# Patient Record
Sex: Male | Born: 1950 | Race: Black or African American | Hispanic: No | Marital: Married | Smoking: Former smoker
Health system: Southern US, Community
[De-identification: ages and names within clinical notes are randomized; demographics above are authoritative.]

## PROBLEM LIST (undated history)

## (undated) DIAGNOSIS — E663 Overweight: Secondary | ICD-10-CM

## (undated) DIAGNOSIS — N400 Enlarged prostate without lower urinary tract symptoms: Secondary | ICD-10-CM

## (undated) DIAGNOSIS — K5730 Diverticulosis of large intestine without perforation or abscess without bleeding: Secondary | ICD-10-CM

## (undated) DIAGNOSIS — Z860100 Personal history of colon polyps, unspecified: Secondary | ICD-10-CM

## (undated) DIAGNOSIS — E7800 Pure hypercholesterolemia, unspecified: Secondary | ICD-10-CM

## (undated) DIAGNOSIS — G4733 Obstructive sleep apnea (adult) (pediatric): Secondary | ICD-10-CM

## (undated) DIAGNOSIS — G4730 Sleep apnea, unspecified: Secondary | ICD-10-CM

## (undated) DIAGNOSIS — E119 Type 2 diabetes mellitus without complications: Secondary | ICD-10-CM

## (undated) DIAGNOSIS — M1990 Unspecified osteoarthritis, unspecified site: Secondary | ICD-10-CM

## (undated) DIAGNOSIS — I10 Essential (primary) hypertension: Secondary | ICD-10-CM

## (undated) DIAGNOSIS — R945 Abnormal results of liver function studies: Secondary | ICD-10-CM

## (undated) DIAGNOSIS — R7309 Other abnormal glucose: Secondary | ICD-10-CM

## (undated) DIAGNOSIS — R7989 Other specified abnormal findings of blood chemistry: Secondary | ICD-10-CM

## (undated) HISTORY — DX: Personal history of colon polyps, unspecified: Z86.0100

## (undated) HISTORY — DX: Benign prostatic hyperplasia without lower urinary tract symptoms: N40.0

## (undated) HISTORY — DX: Abnormal results of liver function studies: R94.5

## (undated) HISTORY — DX: Essential (primary) hypertension: I10

## (undated) HISTORY — PX: VASECTOMY: SHX75

## (undated) HISTORY — DX: Unspecified osteoarthritis, unspecified site: M19.90

## (undated) HISTORY — DX: Other specified abnormal findings of blood chemistry: R79.89

## (undated) HISTORY — DX: Pure hypercholesterolemia, unspecified: E78.00

## (undated) HISTORY — DX: Other abnormal glucose: R73.09

## (undated) HISTORY — DX: Sleep apnea, unspecified: G47.30

## (undated) HISTORY — DX: Type 2 diabetes mellitus without complications: E11.9

## (undated) HISTORY — DX: Overweight: E66.3

## (undated) HISTORY — DX: Obstructive sleep apnea (adult) (pediatric): G47.33

## (undated) HISTORY — DX: Diverticulosis of large intestine without perforation or abscess without bleeding: K57.30

## (undated) HISTORY — DX: Personal history of colonic polyps: Z86.010

---

## 1999-08-01 ENCOUNTER — Ambulatory Visit (HOSPITAL_COMMUNITY): Admission: RE | Admit: 1999-08-01 | Discharge: 1999-08-01 | Payer: Self-pay | Admitting: Orthopedic Surgery

## 1999-08-01 ENCOUNTER — Encounter: Payer: Self-pay | Admitting: Orthopedic Surgery

## 2001-11-28 ENCOUNTER — Emergency Department (HOSPITAL_COMMUNITY): Admission: EM | Admit: 2001-11-28 | Discharge: 2001-11-28 | Payer: Self-pay | Admitting: *Deleted

## 2004-10-17 ENCOUNTER — Ambulatory Visit: Payer: Self-pay | Admitting: Pulmonary Disease

## 2005-03-12 ENCOUNTER — Ambulatory Visit: Payer: Self-pay | Admitting: Pulmonary Disease

## 2005-03-18 ENCOUNTER — Ambulatory Visit: Payer: Self-pay | Admitting: Pulmonary Disease

## 2005-09-02 ENCOUNTER — Ambulatory Visit: Payer: Self-pay | Admitting: Pulmonary Disease

## 2005-11-28 ENCOUNTER — Ambulatory Visit: Payer: Self-pay | Admitting: Pulmonary Disease

## 2005-12-06 ENCOUNTER — Ambulatory Visit: Payer: Self-pay | Admitting: Pulmonary Disease

## 2005-12-19 ENCOUNTER — Ambulatory Visit: Payer: Self-pay | Admitting: Gastroenterology

## 2006-02-04 ENCOUNTER — Ambulatory Visit: Payer: Self-pay | Admitting: Gastroenterology

## 2006-02-11 ENCOUNTER — Encounter (INDEPENDENT_AMBULATORY_CARE_PROVIDER_SITE_OTHER): Payer: Self-pay | Admitting: *Deleted

## 2006-02-11 ENCOUNTER — Ambulatory Visit: Payer: Self-pay | Admitting: Gastroenterology

## 2006-03-07 ENCOUNTER — Ambulatory Visit: Payer: Self-pay | Admitting: Pulmonary Disease

## 2006-03-20 ENCOUNTER — Ambulatory Visit: Payer: Self-pay | Admitting: Cardiology

## 2006-06-24 ENCOUNTER — Ambulatory Visit: Payer: Self-pay | Admitting: Cardiology

## 2006-06-26 ENCOUNTER — Ambulatory Visit: Payer: Self-pay | Admitting: Internal Medicine

## 2006-12-18 ENCOUNTER — Ambulatory Visit: Payer: Self-pay | Admitting: Pulmonary Disease

## 2006-12-18 LAB — CONVERTED CEMR LAB
ALT: 70 units/L — ABNORMAL HIGH (ref 0–40)
AST: 38 units/L — ABNORMAL HIGH (ref 0–37)
Albumin: 4.1 g/dL (ref 3.5–5.2)
Alkaline Phosphatase: 67 units/L (ref 39–117)
BUN: 18 mg/dL (ref 6–23)
Basophils Absolute: 0 10*3/uL (ref 0.0–0.1)
Basophils Relative: 0.7 % (ref 0.0–1.0)
Bilirubin Urine: NEGATIVE
Bilirubin, Direct: 0.2 mg/dL (ref 0.0–0.3)
CO2: 31 meq/L (ref 19–32)
Calcium: 9.5 mg/dL (ref 8.4–10.5)
Chloride: 104 meq/L (ref 96–112)
Cholesterol: 158 mg/dL (ref 0–200)
Creatinine, Ser: 1 mg/dL (ref 0.4–1.5)
Eosinophils Absolute: 0 10*3/uL (ref 0.0–0.6)
Eosinophils Relative: 0.9 % (ref 0.0–5.0)
GFR calc Af Amer: 99 mL/min
GFR calc non Af Amer: 82 mL/min
Glucose, Bld: 117 mg/dL — ABNORMAL HIGH (ref 70–99)
HCT: 43.9 % (ref 39.0–52.0)
HDL: 34.2 mg/dL — ABNORMAL LOW (ref >39.0)
Hemoglobin, Urine: NEGATIVE
Hemoglobin: 15.6 g/dL (ref 13.0–17.0)
Ketones, ur: NEGATIVE mg/dL
LDL Cholesterol: 99 mg/dL (ref 0–99)
Leukocytes, UA: NEGATIVE
Lymphocytes Relative: 44 % (ref 12.0–46.0)
MCHC: 35.6 g/dL (ref 30.0–36.0)
MCV: 93.9 fL (ref 78.0–100.0)
Monocytes Absolute: 0.4 10*3/uL (ref 0.2–0.7)
Monocytes Relative: 9.3 % (ref 3.0–11.0)
Neutro Abs: 1.8 10*3/uL (ref 1.4–7.7)
Neutrophils Relative %: 45.1 % (ref 43.0–77.0)
Nitrite: NEGATIVE
PSA: 0.66 ng/mL (ref 0.10–4.00)
Platelets: 169 10*3/uL (ref 150–400)
Potassium: 3.8 meq/L (ref 3.5–5.1)
RBC: 4.67 M/uL (ref 4.22–5.81)
RDW: 12.4 % (ref 11.5–14.6)
Sodium: 139 meq/L (ref 135–145)
Specific Gravity, Urine: 1.025 (ref 1.000–1.03)
TSH: 1.44 microintl units/mL (ref 0.35–5.50)
Total Bilirubin: 1.4 mg/dL — ABNORMAL HIGH (ref 0.3–1.2)
Total CHOL/HDL Ratio: 4.6
Total Protein, Urine: NEGATIVE mg/dL
Total Protein: 7.3 g/dL (ref 6.0–8.3)
Triglycerides: 123 mg/dL (ref 0–149)
Urine Glucose: 250 mg/dL — AB
Urobilinogen, UA: 0.2 (ref 0.0–1.0)
VLDL: 25 mg/dL (ref 0–40)
WBC: 4.1 10*3/uL — ABNORMAL LOW (ref 4.5–10.5)
pH: 6 (ref 5.0–8.0)

## 2007-01-08 ENCOUNTER — Ambulatory Visit: Payer: Self-pay | Admitting: Pulmonary Disease

## 2008-01-12 DIAGNOSIS — Z87898 Personal history of other specified conditions: Secondary | ICD-10-CM

## 2008-01-12 DIAGNOSIS — E7800 Pure hypercholesterolemia, unspecified: Secondary | ICD-10-CM

## 2008-01-12 DIAGNOSIS — I10 Essential (primary) hypertension: Secondary | ICD-10-CM | POA: Insufficient documentation

## 2008-01-20 ENCOUNTER — Telehealth (INDEPENDENT_AMBULATORY_CARE_PROVIDER_SITE_OTHER): Payer: Self-pay | Admitting: *Deleted

## 2008-02-01 ENCOUNTER — Telehealth: Payer: Self-pay | Admitting: Pulmonary Disease

## 2008-02-02 ENCOUNTER — Ambulatory Visit: Payer: Self-pay | Admitting: Pulmonary Disease

## 2008-02-02 DIAGNOSIS — E663 Overweight: Secondary | ICD-10-CM | POA: Insufficient documentation

## 2008-02-02 DIAGNOSIS — K5730 Diverticulosis of large intestine without perforation or abscess without bleeding: Secondary | ICD-10-CM | POA: Insufficient documentation

## 2008-02-02 DIAGNOSIS — G4733 Obstructive sleep apnea (adult) (pediatric): Secondary | ICD-10-CM

## 2008-02-02 DIAGNOSIS — D126 Benign neoplasm of colon, unspecified: Secondary | ICD-10-CM

## 2008-02-02 DIAGNOSIS — M1990 Unspecified osteoarthritis, unspecified site: Secondary | ICD-10-CM | POA: Insufficient documentation

## 2008-02-02 LAB — CONVERTED CEMR LAB
ALT: 59 units/L — ABNORMAL HIGH (ref 0–53)
AST: 35 units/L (ref 0–37)
Albumin: 4.1 g/dL (ref 3.5–5.2)
Alkaline Phosphatase: 54 units/L (ref 39–117)
BUN: 18 mg/dL (ref 6–23)
Basophils Absolute: 0 10*3/uL (ref 0.0–0.1)
Basophils Relative: 0.9 % (ref 0.0–1.0)
Bilirubin Urine: NEGATIVE
Bilirubin, Direct: 0.2 mg/dL (ref 0.0–0.3)
CO2: 29 meq/L (ref 19–32)
Calcium: 9.4 mg/dL (ref 8.4–10.5)
Chloride: 100 meq/L (ref 96–112)
Cholesterol: 165 mg/dL (ref 0–200)
Creatinine, Ser: 1.1 mg/dL (ref 0.4–1.5)
Eosinophils Absolute: 0 10*3/uL (ref 0.0–0.7)
Eosinophils Relative: 1.2 % (ref 0.0–5.0)
GFR calc Af Amer: 88 mL/min
GFR calc non Af Amer: 73 mL/min
Glucose, Bld: 158 mg/dL — ABNORMAL HIGH (ref 70–99)
HCT: 44 % (ref 39.0–52.0)
HDL: 36.8 mg/dL — ABNORMAL LOW (ref >39.0)
Hemoglobin, Urine: NEGATIVE
Hemoglobin: 15.7 g/dL (ref 13.0–17.0)
Ketones, ur: NEGATIVE mg/dL
LDL Cholesterol: 95 mg/dL (ref 0–99)
Leukocytes, UA: NEGATIVE
Lymphocytes Relative: 45.7 % (ref 12.0–46.0)
MCHC: 35.6 g/dL (ref 30.0–36.0)
MCV: 96.4 fL (ref 78.0–100.0)
Monocytes Absolute: 0.5 10*3/uL (ref 0.1–1.0)
Monocytes Relative: 11.4 % (ref 3.0–12.0)
Neutro Abs: 1.7 10*3/uL (ref 1.4–7.7)
Neutrophils Relative %: 40.8 % — ABNORMAL LOW (ref 43.0–77.0)
Nitrite: NEGATIVE
PSA: 0.52 ng/mL (ref 0.10–4.00)
Platelets: 161 10*3/uL (ref 150–400)
Potassium: 4.2 meq/L (ref 3.5–5.1)
RBC: 4.56 M/uL (ref 4.22–5.81)
RDW: 12.6 % (ref 11.5–14.6)
Sodium: 136 meq/L (ref 135–145)
Specific Gravity, Urine: >=1.03 (ref 1.000–1.03)
TSH: 1.64 microintl units/mL (ref 0.35–5.50)
Total Bilirubin: 1.4 mg/dL — ABNORMAL HIGH (ref 0.3–1.2)
Total CHOL/HDL Ratio: 4.5
Total Protein, Urine: NEGATIVE mg/dL
Total Protein: 7.1 g/dL (ref 6.0–8.3)
Triglycerides: 165 mg/dL — ABNORMAL HIGH (ref 0–149)
Urine Glucose: NEGATIVE mg/dL
Urobilinogen, UA: 0.2 (ref 0.0–1.0)
VLDL: 33 mg/dL (ref 0–40)
WBC: 4.1 10*3/uL — ABNORMAL LOW (ref 4.5–10.5)
pH: 5.5 (ref 5.0–8.0)

## 2008-02-09 ENCOUNTER — Telehealth: Payer: Self-pay | Admitting: Pulmonary Disease

## 2008-06-20 ENCOUNTER — Telehealth (INDEPENDENT_AMBULATORY_CARE_PROVIDER_SITE_OTHER): Payer: Self-pay | Admitting: *Deleted

## 2008-10-14 ENCOUNTER — Telehealth (INDEPENDENT_AMBULATORY_CARE_PROVIDER_SITE_OTHER): Payer: Self-pay | Admitting: *Deleted

## 2009-01-06 ENCOUNTER — Ambulatory Visit: Payer: Self-pay | Admitting: Pulmonary Disease

## 2009-02-17 ENCOUNTER — Telehealth (INDEPENDENT_AMBULATORY_CARE_PROVIDER_SITE_OTHER): Payer: Self-pay | Admitting: *Deleted

## 2009-05-04 ENCOUNTER — Encounter (INDEPENDENT_AMBULATORY_CARE_PROVIDER_SITE_OTHER): Payer: Self-pay | Admitting: *Deleted

## 2009-06-09 ENCOUNTER — Encounter (INDEPENDENT_AMBULATORY_CARE_PROVIDER_SITE_OTHER): Payer: Self-pay | Admitting: *Deleted

## 2009-06-12 ENCOUNTER — Ambulatory Visit: Payer: Self-pay | Admitting: Gastroenterology

## 2009-06-27 ENCOUNTER — Ambulatory Visit: Payer: Self-pay | Admitting: Gastroenterology

## 2009-06-29 ENCOUNTER — Encounter: Payer: Self-pay | Admitting: Gastroenterology

## 2009-07-04 ENCOUNTER — Telehealth: Payer: Self-pay | Admitting: Pulmonary Disease

## 2009-07-06 ENCOUNTER — Emergency Department (HOSPITAL_COMMUNITY): Admission: EM | Admit: 2009-07-06 | Discharge: 2009-07-06 | Payer: Self-pay | Admitting: Emergency Medicine

## 2009-08-10 ENCOUNTER — Telehealth (INDEPENDENT_AMBULATORY_CARE_PROVIDER_SITE_OTHER): Payer: Self-pay | Admitting: *Deleted

## 2009-09-22 ENCOUNTER — Ambulatory Visit: Payer: Self-pay | Admitting: Pulmonary Disease

## 2009-10-30 ENCOUNTER — Telehealth (INDEPENDENT_AMBULATORY_CARE_PROVIDER_SITE_OTHER): Payer: Self-pay | Admitting: *Deleted

## 2010-01-10 ENCOUNTER — Ambulatory Visit: Payer: Self-pay | Admitting: Pulmonary Disease

## 2010-01-13 DIAGNOSIS — Z862 Personal history of diseases of the blood and blood-forming organs and certain disorders involving the immune mechanism: Secondary | ICD-10-CM

## 2010-01-13 DIAGNOSIS — Z8639 Personal history of other endocrine, nutritional and metabolic disease: Secondary | ICD-10-CM | POA: Insufficient documentation

## 2010-01-13 LAB — CONVERTED CEMR LAB
ALT: 57 units/L — ABNORMAL HIGH (ref 0–53)
AST: 39 units/L — ABNORMAL HIGH (ref 0–37)
Albumin: 4.4 g/dL (ref 3.5–5.2)
Alkaline Phosphatase: 61 units/L (ref 39–117)
BUN: 29 mg/dL — ABNORMAL HIGH (ref 6–23)
Basophils Absolute: 0 10*3/uL (ref 0.0–0.1)
Basophils Relative: 1.1 % (ref 0.0–3.0)
Bilirubin Urine: NEGATIVE
Bilirubin, Direct: 0.2 mg/dL (ref 0.0–0.3)
CO2: 29 meq/L (ref 19–32)
Calcium: 9.9 mg/dL (ref 8.4–10.5)
Chloride: 107 meq/L (ref 96–112)
Cholesterol: 180 mg/dL (ref 0–200)
Creatinine, Ser: 1 mg/dL (ref 0.4–1.5)
Eosinophils Absolute: 0.1 10*3/uL (ref 0.0–0.7)
Eosinophils Relative: 2 % (ref 0.0–5.0)
GFR calc non Af Amer: 100.04 mL/min (ref >60)
Glucose, Bld: 148 mg/dL — ABNORMAL HIGH (ref 70–99)
HCT: 41 % (ref 39.0–52.0)
HDL: 42.5 mg/dL (ref >39.00)
Hemoglobin: 14.5 g/dL (ref 13.0–17.0)
Hgb A1c MFr Bld: 6.6 % — ABNORMAL HIGH (ref 4.6–6.5)
Ketones, ur: NEGATIVE mg/dL
LDL Cholesterol: 108 mg/dL — ABNORMAL HIGH (ref 0–99)
Leukocytes, UA: NEGATIVE
Lymphocytes Relative: 45.1 % (ref 12.0–46.0)
Lymphs Abs: 1.8 10*3/uL (ref 0.7–4.0)
MCHC: 35.4 g/dL (ref 30.0–36.0)
MCV: 96.9 fL (ref 78.0–100.0)
Monocytes Absolute: 0.5 10*3/uL (ref 0.1–1.0)
Monocytes Relative: 12.4 % — ABNORMAL HIGH (ref 3.0–12.0)
Neutro Abs: 1.6 10*3/uL (ref 1.4–7.7)
Neutrophils Relative %: 39.4 % — ABNORMAL LOW (ref 43.0–77.0)
Nitrite: NEGATIVE
PSA: 0.59 ng/mL (ref 0.10–4.00)
Platelets: 164 10*3/uL (ref 150.0–400.0)
Potassium: 5.2 meq/L — ABNORMAL HIGH (ref 3.5–5.1)
RBC: 4.23 M/uL (ref 4.22–5.81)
RDW: 12.7 % (ref 11.5–14.6)
Sodium: 142 meq/L (ref 135–145)
Specific Gravity, Urine: >=1.03 (ref 1.000–1.030)
TSH: 1.74 microintl units/mL (ref 0.35–5.50)
Total Bilirubin: 1.1 mg/dL (ref 0.3–1.2)
Total CHOL/HDL Ratio: 4
Total Protein, Urine: NEGATIVE mg/dL
Total Protein: 7.1 g/dL (ref 6.0–8.3)
Triglycerides: 148 mg/dL (ref 0.0–149.0)
Urine Glucose: NEGATIVE mg/dL
Urobilinogen, UA: 0.2 (ref 0.0–1.0)
VLDL: 29.6 mg/dL (ref 0.0–40.0)
WBC: 4.1 10*3/uL — ABNORMAL LOW (ref 4.5–10.5)
pH: 5.5 (ref 5.0–8.0)

## 2010-01-25 ENCOUNTER — Telehealth (INDEPENDENT_AMBULATORY_CARE_PROVIDER_SITE_OTHER): Payer: Self-pay | Admitting: *Deleted

## 2010-03-12 ENCOUNTER — Telehealth (INDEPENDENT_AMBULATORY_CARE_PROVIDER_SITE_OTHER): Payer: Self-pay | Admitting: *Deleted

## 2010-05-08 ENCOUNTER — Telehealth: Payer: Self-pay | Admitting: Pulmonary Disease

## 2010-05-22 ENCOUNTER — Telehealth (INDEPENDENT_AMBULATORY_CARE_PROVIDER_SITE_OTHER): Payer: Self-pay | Admitting: *Deleted

## 2010-05-22 ENCOUNTER — Encounter: Payer: Self-pay | Admitting: Pulmonary Disease

## 2010-07-31 ENCOUNTER — Encounter
Admission: RE | Admit: 2010-07-31 | Discharge: 2010-07-31 | Payer: Self-pay | Source: Home / Self Care | Attending: Orthopedic Surgery | Admitting: Orthopedic Surgery

## 2010-08-26 LAB — CONVERTED CEMR LAB
ALT: 65 units/L — ABNORMAL HIGH (ref 0–53)
AST: 47 units/L — ABNORMAL HIGH (ref 0–37)
Albumin: 4.3 g/dL (ref 3.5–5.2)
Alkaline Phosphatase: 71 units/L (ref 39–117)
BUN: 24 mg/dL — ABNORMAL HIGH (ref 6–23)
Basophils Absolute: 0 10*3/uL (ref 0.0–0.1)
Basophils Relative: 0.4 % (ref 0.0–3.0)
Bilirubin Urine: NEGATIVE
Bilirubin, Direct: 0.2 mg/dL (ref 0.0–0.3)
CO2: 33 meq/L — ABNORMAL HIGH (ref 19–32)
Calcium: 9.8 mg/dL (ref 8.4–10.5)
Chloride: 104 meq/L (ref 96–112)
Cholesterol: 148 mg/dL (ref 0–200)
Creatinine, Ser: 1 mg/dL (ref 0.4–1.5)
Eosinophils Absolute: 0 10*3/uL (ref 0.0–0.7)
Eosinophils Relative: 0.6 % (ref 0.0–5.0)
GFR calc non Af Amer: 98.07 mL/min (ref >60)
Glucose, Bld: 111 mg/dL — ABNORMAL HIGH (ref 70–99)
HCT: 43.2 % (ref 39.0–52.0)
HDL: 43.6 mg/dL (ref >39.00)
Hemoglobin, Urine: NEGATIVE
Hemoglobin: 15.2 g/dL (ref 13.0–17.0)
Hgb A1c MFr Bld: 6.4 % (ref 4.6–6.5)
Ketones, ur: NEGATIVE mg/dL
LDL Cholesterol: 90 mg/dL (ref 0–99)
Leukocytes, UA: NEGATIVE
Lymphocytes Relative: 46.1 % — ABNORMAL HIGH (ref 12.0–46.0)
Lymphs Abs: 1.4 10*3/uL (ref 0.7–4.0)
MCHC: 35.3 g/dL (ref 30.0–36.0)
MCV: 96.8 fL (ref 78.0–100.0)
Monocytes Absolute: 0.5 10*3/uL (ref 0.1–1.0)
Monocytes Relative: 15.1 % — ABNORMAL HIGH (ref 3.0–12.0)
Neutro Abs: 1.2 10*3/uL — ABNORMAL LOW (ref 1.4–7.7)
Neutrophils Relative %: 37.8 % — ABNORMAL LOW (ref 43.0–77.0)
Nitrite: NEGATIVE
Platelets: 153 10*3/uL (ref 150.0–400.0)
Potassium: 4.2 meq/L (ref 3.5–5.1)
RBC: 4.46 M/uL (ref 4.22–5.81)
RDW: 12.3 % (ref 11.5–14.6)
Sodium: 140 meq/L (ref 135–145)
Specific Gravity, Urine: 1.02 (ref 1.000–1.030)
TSH: 1.08 microintl units/mL (ref 0.35–5.50)
Total Bilirubin: 1.6 mg/dL — ABNORMAL HIGH (ref 0.3–1.2)
Total CHOL/HDL Ratio: 3
Total Protein, Urine: NEGATIVE mg/dL
Total Protein: 7.5 g/dL (ref 6.0–8.3)
Triglycerides: 74 mg/dL (ref 0.0–149.0)
Urine Glucose: NEGATIVE mg/dL
Urobilinogen, UA: 0.2 (ref 0.0–1.0)
VLDL: 14.8 mg/dL (ref 0.0–40.0)
WBC: 3.1 10*3/uL — ABNORMAL LOW (ref 4.5–10.5)
pH: 5.5 (ref 5.0–8.0)

## 2010-08-30 NOTE — Assessment & Plan Note (Signed)
Summary: physical 1 yr ///kp   CC:  Yearly ROV & CPX....  History of Present Illness: 60 y/o BM here for a folllow up visit and yearly CPX...    ~  Jun10:  he reports having a good year, no new complaints or concerns... he is still too heavy but by his own admission he really hasn't been dieting, exercising, etc...   ~  January 10, 2010:  Yearly ROV doing satis & his CC is paresthesias in toes> could be early neuropathy w/ his mild gluc intol & offered Lyrica trial but it's not that bad & he wants to watch it... he notes rare episodes of self-limited palpit> rapid regular & review of hx shows episode of PAT yrs ago Rx in ER w/ "shot" converted to NSR... we discussed no caffeine etc & he will let us know if symptoms worsen...    Current Problem List:  PHYSICAL EXAMINATION (ICD-V70.0) - he is 60 y/o... ex-smoker, quit  ~70yrs... he sees DrNesi for Urology yearly... he had Pneumovax in 62 (age 63)...  Hx of OBSTRUCTIVE SLEEP APNEA (ICD-327.23) - eval in 1996 w/ sleep study showing RDI= 19, mod snoring, O2 sat to 90%... saw DrRedman for ENT due to snoring & tonsillar hypertrophy- offered UPPP surg but pt declined... since then he's gained weight, continued snoring w/o daytime hypersomn etc... not interested in further evaluation...   HYPERTENSION (ICD-401.9) - on DIOVAN/Hct 160-25 daily... BP= 142/80 today & even better at home ave= 120's/ 80's, feeling well, tolerating med... denies HA, fatigue, visual changes, CP, palipit, dizziness, syncope, dyspnea, edema, etc...  ~  baseline EKG showed NSR, WNL.[name].  ~  baseline CXR showed clear, NAD... DJD spine.  HYPERCHOLESTEROLEMIA (ICD-272.0) - on LIPITOR 80mg /d...  ~  FLP 5/08 (wt= 234#) showed TChol 158, TG 123, HDL 34, LDL 99  ~  FLP 7/09 (wt= 247#) showed TChol 165, TG 165, HDL 37, LDL 95... same med, better diet, lose wt.  ~  FLP 6/10 (wt= 241#) showed TChol 148, TG 74, HDL 44, LDL 90  ~  FLP 6/11 (wt=240#) showed TChol 180, TG 148, HDL 43, LDL  108... needs to get wt down!  OTHER ABNORMAL GLUCOSE (ICD-790.29) - hx borderline BS in past> diet Rx...  ~  labs 6/10 showed BS= 111, A1c= 6.4  ~  labs 6/11 showed BS= 148, A1c= 6.6.Marland KitchenMarland Kitchen may need meds, get weight down.  OVERWEIGHT (ICD-278.02) - as above: we discussed diet, exercise, get weight down...  ~  weight 6/10 = 241#  ~  weight 6/11 = 240#  DIVERTICULOSIS OF COLON (ICD-562.10) & COLONIC POLYPS (ICD-211.3)  ~  colonoscopy 7/07 by DrStark showed 7mm polyp= tubular adenoma...  ~  colonoscopy 11/10 showed divertics, polyp= tub adenoma, +hems... f/u planned 54yrs.  LIVER FUNCTION TESTS, ABNORMAL, HX OF (ICD-V12.2) - could be Statin related vs Steatosis...  ~  labs 6/10 showed AlkPhos=71, SGOT=47, SGPT=65  ~  labs 6/11 showed AlkPhos=61, SGOT=39, SGPT=57  BENIGN PROSTATIC HYPERTROPHY, HX OF (ICD-V13.8) - hx of elevated PSA w/ neg prostate bx's from [facility] in 2003 (PSA was 17)...  ~  PSA 5/08 was 0.66...  ~  PSA 7/09 is 0.52...  ~  Eval 6/10 DrNesi: doing well, PSA reported 0.90  ~  labs 6/11 showed PSA= 0.59  DEGENERATIVE JOINT DISEASE (ICD-715.90) - he uses ETODOLAC as needed.   Preventive Screening-Counseling & Management  Alcohol-Tobacco     Smoking Status: quit     Year Quit: 1980  Allergies (verified): No Known  Drug Allergies  Comments:  Nurse/Medical Assistant: The patient's medications and allergies were reviewed with the patient and were updated in the Medication and Allergy Lists.  Past History:  Past Medical History: Hx of OBSTRUCTIVE SLEEP APNEA (ICD-327.23) HYPERTENSION (ICD-401.9) HYPERCHOLESTEROLEMIA (ICD-272.0) OTHER ABNORMAL GLUCOSE (ICD-790.29) OVERWEIGHT (ICD-278.02) DIVERTICULOSIS OF COLON (ICD-562.10) COLONIC POLYPS (ICD-211.3) LIVER FUNCTION TESTS, ABNORMAL, HX OF (ICD-V12.2) BENIGN PROSTATIC HYPERTROPHY, HX OF (ICD-V13.8) DEGENERATIVE JOINT DISEASE (ICD-715.90)  Past Surgical History: S/P vasectomy by Thermon Leyland  Family History: Reviewed  history from 02/02/2008 and no changes required. Father is alive, age 74, w/ DM and renal disease Mother died, age 17, w/ alzheimer's disease (bed ridden for 6 yrs) 7 sibs: no hx of heart disease or cancer 5 Bro- one w/ DM 2 Sis- a/w  Social History: Reviewed history from 09/22/2009 and no changes required. quit smoking in 1980--smoked for 10 years---1 pack x 3 days occ alcohol married 2 children owns [employer]  Review of Systems       The patient complains of palpitations and arthritis.  The patient denies fever, chills, sweats, anorexia, fatigue, weakness, malaise, weight loss, sleep disorder, blurring, diplopia, eye irritation, eye discharge, vision loss, eye pain, photophobia, earache, ear discharge, tinnitus, decreased hearing, nasal congestion, nosebleeds, sore throat, hoarseness, chest pain, syncope, dyspnea on exertion, orthopnea, PND, peripheral edema, cough, dyspnea at rest, excessive sputum, hemoptysis, wheezing, pleurisy, nausea, vomiting, diarrhea, constipation, change in bowel habits, abdominal pain, melena, hematochezia, jaundice, gas/bloating, indigestion/heartburn, dysphagia, odynophagia, dysuria, hematuria, urinary frequency, urinary hesitancy, nocturia, incontinence, back pain, joint pain, joint swelling, muscle cramps, muscle weakness, stiffness, sciatica, restless legs, leg pain at night, leg pain with exertion, rash, itching, dryness, suspicious lesions, paralysis, paresthesias, seizures, tremors, vertigo, transient blindness, frequent falls, frequent headaches, difficulty walking, depression, anxiety, memory loss, confusion, cold intolerance, heat intolerance, polydipsia, polyphagia, polyuria, unusual weight change, abnormal bruising, bleeding, enlarged lymph nodes, urticaria, allergic rash, hay fever, and recurrent infections.    Vital Signs:  Patient profile:   60 year old male Height:      71 inches Weight:      239.31 pounds BMI:     33.50 O2 Sat:       97 % on Room air Temp:     97.2 degrees F oral Pulse rate:   70 / minute BP sitting:   142 / 80  (right arm) Cuff size:   regular  Vitals Entered By: Randell Loop CMA (January 10, 2010 9:01 AM)  O2 Sat at Rest %:  97 O2 Flow:  Room air CC: Yearly ROV & CPX... Is Patient Diabetic? No Pain Assessment Patient in pain? no      Comments meds updated today with pt   Physical Exam  Additional Exam:  WD, Overweight, 60  y/o BM in NAD... GENERAL:  Alert & oriented; pleasant & cooperative... HEENT:  [place]/AT, EOM-wnl, PERRLA, EACs-clear, TMs-wnl, NOSE-clear, THROAT-clear & wnl. NECK:  Supple w/ full ROM; no JVD; normal carotid impulses w/o bruits; no thyromegaly or nodules palpated; no lymphadenopathy. CHEST:  Clear to P & A; without wheezes/ rales/ or rhonchi heard... HEART:  Regular Rhythm; without murmurs/ rubs/ or gallops detected... ABDOMEN:  Soft & nontender; normal bowel sounds; no organomegaly or masses palpated... EXT: without deformities or arthritic changes; no varicose veins/ venous insuffic/ or edema. NEURO:  CN's intact; motor testing normal; sensory testing normal; gait normal & balance OK DERM:  No lesions noted; no rash etc...    CXR  Procedure date:  01/10/2010  Findings:  CHEST - 2 VIEW Comparison: 01/06/2009   Findings: Cardiomediastinal silhouette is within normal limits. The lungs are clear. No pleural effusion.  No pneumothorax.  No acute osseous abnormality.   IMPRESSION: Normal exam.   Read By:  Harrel Lemon,  MD    EKG  Procedure date:  01/10/2010  Findings:      Normal sinus rhythm with rate of:  72/min... Tracing is WNL, NAD...  SN   MISC. Report  Procedure date:  01/10/2010  Findings:      BMP (METABOL)   Sodium                    142 mEq/L                   135-145   Potassium            [H]  5.2 mEq/L                   3.5-5.1   Chloride                  107 mEq/L                   96-112   Carbon Dioxide            29  mEq/L                    19-32   Glucose              [H]  148 mg/dL                   16-10   BUN                  [H]  29 mg/dL                    9-60   Creatinine                1.0 mg/dL                   4.5-4.0   Calcium                   9.9 mg/dL                   9.8-11.9   GFR                       100.04 mL/min               >60  Lipid Panel (LIPID)   Cholesterol               180 mg/dL                   1-478   Triglycerides             148.0 mg/dL                 2.9-562.1   HDL                       30.86 mg/dL                 >57.84   LDL Cholesterol      [H]  696 mg/dL  0-99  CBC Platelet w/Diff (CBCD)   White Cell Count     [L]  4.1 K/uL                    4.5-10.5   Red Cell Count            4.23 Mil/uL                 4.22-5.81   Hemoglobin                14.5 g/dL                   16.1-09.6   Hematocrit                41.0 %                      39.0-52.0   MCV                       96.9 fl                     78.0-100.0   Platelet Count            164.0 K/uL                  150.0-400.0   Neutrophil %         [L]  39.4 %                      43.0-77.0   Lymphocyte %              45.1 %                      12.0-46.0   Monocyte %           [H]  12.4 %                      3.0-12.0   Eosinophils%              2.0 %                       0.0-5.0   Basophils %               1.1 %   Comments:      Hepatic/Liver Function Panel (HEPATIC)   Total Bilirubin           1.1 mg/dL                   0.4-5.4   Direct Bilirubin          0.2 mg/dL                   0.9-8.1   Alkaline Phosphatase      61 U/L                      39-117   AST                  [H]  39 U/L                      0-37   ALT                  [H]  57 U/L                      0-53   Total Protein             7.1 g/dL                    3.0-8.6   Albumin                   4.4 g/dL                    5.7-8.4  TSH (TSH)   FastTSH                   1.74 uIU/mL                  0.35-5.50  Prostate Specific Antigen (PSA)   PSA-Hyb                   0.59 ng/mL                  0.10-4.00  UDip Only (UDIP)   Color                     YELLOW   Clarity                   CLEAR                       Clear   Specific Gravity          >=1.030                     1.000 - 1.030   Urine Ph                  5.5                         5.0-8.0   Protein                   NEGATIVE                    Negative   Urine Glucose             NEGATIVE                    Negative   Ketones                   NEGATIVE                    Negative   Urine Bilirubin           NEGATIVE                    Negative   Blood                     TRACE-INTACT                Negative   Urobilinogen              0.2                         0.0 - 1.0   Leukocyte Esterace  NEGATIVE                    Negative   Nitrite                   NEGATIVE                    Negative  Hemoglobin A1C (A1C)   Hemoglobin A1C       [H]  6.6 %                       4.6-6.5   Impression & Recommendations:  Problem # 1:  Hx of OBSTRUCTIVE SLEEP APNEA (ICD-327.23) He denies sleep disordered breathing, snoring, daytime hypersom, or wife's complaints... not interested in further eval...  Problem # 2:  HYPERTENSION (ICD-401.9) BP controlled on med... need to get weight down... His updated medication list for this problem includes:    Diovan Hct 160-25 Mg Tabs (Valsartan-hydrochlorothiazide) .[name]... Take 1 tablet by mouth once a day  Problem # 3:  HYPERCHOLESTEROLEMIA (ICD-272.0) On max Lipitor... needs to get weight down w/ low fat diet for further improvement... His updated medication list for this problem includes:    Lipitor 80 Mg Tabs (Atorvastatin calcium) .[name]... Take one tablet by mouth at bedtime  Problem # 4:  COLONIC POLYPS (ICD-211.3) GI is stable & colon up to date...  Problem # 5:  BENIGN PROSTATIC HYPERTROPHY, HX OF (ICD-V13.8) GU per DrNesi & PSA is OK...  Problem # 6:  DEGENERATIVE JOINT  DISEASE (ICD-715.90) He uses the NSAID Prn... His updated medication list for this problem includes:    Etodolac 400 Mg Tabs (Etodolac) .[name]... Take 1 tab by mouth two times a day as needed for arthritis pain...  Complete Medication List: 1)  Zyrtec Allergy 10 Mg Tabs (Cetirizine hcl) .... As needed 2)  Diovan Hct 160-25 Mg Tabs (Valsartan-hydrochlorothiazide) .... Take 1 tablet by mouth once a day 3)  Lipitor 80 Mg Tabs (Atorvastatin calcium) .... Take one tablet by mouth at bedtime 4)  Viagra 100 Mg Tabs (Sildenafil citrate) .... Take as directed... 5)  Etodolac 400 Mg Tabs (Etodolac) .... Take 1 tab by mouth two times a day as needed for arthritis pain.Marland KitchenMarland Kitchen 6)  Multivitamins Tabs (Multiple vitamin) .... Take 1 tablet by mouth once a day 7)  Freestyle Lite Devi (Blood glucose monitoring suppl) .... Use as directed 8)  Freestyle Lancets Misc (Lancets) .... Use as directed 9)  Freestyle Lite Test Strp (Glucose blood) .... Use as directed  Other Orders: T-2 View CXR ([id number])  Patient Instructions: 1)  Today we updated your med list- see below.... 2)  We refilled your meds per request... 3)  Today we did your follow up CXR, EKG, & FASTING blood work... please call the "phone tree" in a few days for your lab results.Marland KitchenMarland Kitchen 4)  Cheo> let's get on track w/ our diet+exercise program... the goal is to lose 15-20 lbs!!! 5)  Call for any problems.Marland KitchenMarland Kitchen 6)  Please schedule a follow-up appointment in 1 year. Prescriptions: ETODOLAC 400 MG TABS (ETODOLAC) take 1 tab by mouth two times a day as needed for arthritis pain...  #100 x 4   Entered and Authorized by:   Michele Mcalpine MD   Signed by:   Michele Mcalpine MD on 01/10/2010   Method used:   Print then Give to Patient   RxID:   [id number] LIPITOR 80 MG  TABS (ATORVASTATIN CALCIUM) take  one tablet by mouth at bedtime  #90 x 4   Entered and Authorized by:   Michele Mcalpine MD   Signed by:   Michele Mcalpine MD on 01/10/2010   Method used:   Print then  Give to Patient   RxID:   [id number] DIOVAN HCT 160-25 MG  TABS (VALSARTAN-HYDROCHLOROTHIAZIDE) Take 1 tablet by mouth once a day  #90 x 4   Entered and Authorized by:   Michele Mcalpine MD   Signed by:   Michele Mcalpine MD on 01/10/2010   Method used:   Print then Give to Patient   RxID:   [id number]    CardioPerfect ECG  ID: [id number] Patient: Levi Lee DOB: [birth year] Age: 60 Years Old Sex: Male Race: Black Physician: Sriya Kroeze Technician: Randell Loop CMA Height: 71 Weight: 239.31 Status: Unconfirmed Past Medical History:  PHYSICAL EXAMINATION (ICD-V70.0) -  ex-smoker, quit  ~36yrs... he sees DrNesi for Urology yearly and last saw him about 2 weeks ago- doing well & PSA reported by pt= 0.90  Hx of OBSTRUCTIVE SLEEP APNEA (ICD-327.23) - eval in 1996 w/ sleep study showing RDI= 19, mod snoring, O2 sat to 90%... saw DrRedman for ENT due to snoring & tonsillar hypertrophy- offered UPPP surg but pt declined... since then he's gained weight, continued snoring w/o daytime hypersomn etc... not interested in further evaluation...   HYPERTENSION (ICD-401.9) - on DIOVAN/Hct 160-25 daily...   ~  baseline EKG showed NSR, WNL.[name].  ~  baseline CXR showed clear, NAD... DJD spine.  HYPERCHOLESTEROLEMIA (ICD-272.0) - on LIPITOR 80mg /d...  ~  FLP 5/08 (wt= 234#) showed TChol 158, TG 123, HDL 34, LDL 99  ~  FLP 7/09 (wt= 247#) showed TChol 165, TG 165, HDL 37, LDL 95... same med, better diet, lose wt.  ~  FLP 6/10 (wt= 241#) showed TChol 148, TG 74, HDL 44, LDL 90  OVERWEIGHT (ICD-278.02) - as above: we discussed diet, exercise, get weight down...  DIVERTICULOSIS OF COLON (ICD-562.10) & COLONIC POLYPS (ICD-211.3) - last colonoscopy 7/07 by DrStark showed 7mm polyp= tubular adenoma w/ f/u planned 56yrs...  BENIGN PROSTATIC HYPERTROPHY, HX OF (ICD-V13.8) - hx of elevated PSA w/ neg prostate bx's from [facility] in 2003 (PSA was 17)...  ~  PSA 5/08 was 0.66...  ~  PSA 7/09 is 0.52...  ~   Eval 6/10 DrNesi: doing well, PSA reported 0.90  DEGENERATIVE JOINT DISEASE (ICD-715.90) - he uses ETODOLAC as needed.   Recorded: 01/10/2010 09:11 AM P/PR: 138 ms / 183 ms - Heart rate (maximum exercise) QRS: 97 QT/QTc/QTd: 399 ms / 418 ms / 61 ms - Heart rate (maximum exercise)  P/QRS/T axis: 49 deg / 0 deg / 53 deg - Heart rate (maximum exercise)  Heartrate: 71 bpm  Interpretation:  Normal sinus rhythm with rate of:  72/min... Tracing is WNL, NAD...  SN

## 2010-08-30 NOTE — Miscellaneous (Signed)
Summary: Etodolac/[facility]  Etodolac/[facility]   Imported By: Sherian Rein 06/01/2010 08:44:43  _____________________________________________________________________  External Attachment:    Type:   Image     Comment:   External Document

## 2010-08-30 NOTE — Progress Notes (Signed)
Summary: med clarification - LMTCB x1  Phone Note Outgoing Call Call back at [facility] Phone [phone]   Call placed by: Boone Master CNA/MA,  May 22, 2010 12:17 PM Call placed to: Patient Summary of Call: received fax from [facility] requesting clarification on duplicate meds: etodolac 400mg  #100 w/ 4 refills and meloxicam 15mg  #30 w/ 0 refills written on 10.3.11.  no record of mobic in pt's chart.  LMOM TCB for patient to see if he has already received this med in mail and finished the rx.  called [facility] [phone] inv # [id number] [phone]) to find out the prescribing physician for the mobic.  was told by pharm tech that it was an orthopedic surgeon: Dr. Clovis Riley.  will await pt's return call.  Follow-up for Phone Call        Spoke with pt.  He states that he has been taking meloicam per surgeon but has finished his rx for this.  We refileld etodalac. Follow-up by: Vernie Murders,  May 22, 2010 3:55 PM

## 2010-08-30 NOTE — Progress Notes (Signed)
Summary: rx for lipitor  Phone Note Call from Patient Call back at Home Phone [phone]   Caller: Patient Call For: nadel Reason for Call: Refill Medication, Talk to Nurse Summary of Call: Need Lipitor called in to [facility] - 90 day supply w/ refills Initial call taken by: Eugene Gavia,  October 30, 2009 2:31 PM  Follow-up for Phone Call        [facility] informing pt rx sent to pharmacy. Aundra Millet Reynolds LPN  October 30, 9145 2:58 PM     Prescriptions: LIPITOR 80 MG  TABS (ATORVASTATIN CALCIUM) take one tablet by mouth at bedtime  #90 x 3   Entered by:   Arman Filter LPN   Authorized by:   Michele Mcalpine MD   Signed by:   Arman Filter LPN on 82/95/6213   Method used:   Electronically to        [facility] (mail-order)             ,          Ph: [phone]       Fax: [phone]   RxID:   [id number]

## 2010-08-30 NOTE — Progress Notes (Signed)
Summary: prescript for 90 day supply of crestor  Phone Note Call from Patient   Caller: Patient Call For: nadel Summary of Call: need crestor prescript for 90 day supply sent to pt Initial call taken by: Rickard Patience,  May 08, 2010 3:34 PM  Follow-up for Phone Call        called and spoke with pt.  pt was given # 30 x 11 refills in August 2011.  However, pt states he would now like a 90 day supply  to mail off to his pharmacy.  I offered to send this med electronically to him to his mail order pharmacy but pt wished to have rx mailed to him home address (which I verified was correct in EMR)  .  Printed rx and put on SN's cart for him to sign.  Arman Filter LPN  May 08, 2010 4:13 PM   Additional Follow-up for Phone Call Additional follow up Details #1::        rx has been mailed to pt upon pts request Randell Loop [facility]  May 08, 2010 4:54 PM     Prescriptions: CRESTOR 5 MG TABS (ROSUVASTATIN CALCIUM) take 1 by mouth once daily  #90 x 3   Entered by:   Arman Filter LPN   Authorized by:   Michele Mcalpine MD   Signed by:   Arman Filter LPN on 32/44/0102   Method used:   Print then Mail to Patient   RxID:   [id number]

## 2010-08-30 NOTE — Assessment & Plan Note (Signed)
Summary: Acute NP follow up    CC:  soreness in bottom of both feet and tingling in toes x2weeks - denies redness, heat, swelling.  also c/o sinus pressure/congestion with green nasal drianage, PND causing sore throat x3days, and would like a zpak.Levi Lee  History of Present Illness: 21  with known hx of HTN, Hyperlipidemia and   y/o BM here for a folllow up visit and yearly CPX... he reports having a good year, no new complaints or concerns... he is still too heavy butby his own admission he really hasn't been dieting, exercising, etc...  September 22, 2009 --Presents for soreness in bottom of both feet and tingling in toes x2weeks - denies redness, heat, swelling.  also c/o sinus pressure/congestion with green nasal drianage, PND causing sore throat x3days, would like a zpak.OTC not helping. He has no known injury. Pain is mainly along base of great toes right >left. He has bordeline DM w/ last A1C 6.4 6/10. fasting bs at home 120-130. Denies chest pain, dyspnea, orthopnea, hemoptysis, fever, n/v/d, edema, headache, back pain, radicular symtpoms, loss of sensation.     Medications Prior to Update: 1)  Diovan Hct 160-25 Mg  Tabs (Valsartan-Hydrochlorothiazide) .... Take 1 Tablet By Mouth Once A Day 2)  Lipitor 80 Mg  Tabs (Atorvastatin Calcium) .... Take One Tablet By Mouth At Bedtime 3)  Etodolac 400 Mg Tabs (Etodolac) .... Take 1 Tab By Mouth Two Times A Day As Needed For Arthritis Pain.Levi KitchenMarland Lee 4)  Multivitamins   Tabs (Multiple Vitamin) .... Take 1 Tablet By Mouth Once A Day 5)  Freestyle Lite   Devi (Blood Glucose Monitoring Suppl) .... Use As Directed 6)  Freestyle Lancets   Misc (Lancets) .... Use As Directed 7)  Freestyle Lite Test   Strp (Glucose Blood) .... Use As Directed 8)  Zyrtec Allergy 10 Mg  Tabs (Cetirizine Hcl) .... As Needed 9)  Viagra 100 Mg  Tabs (Sildenafil Citrate) .... Take As Directed... 10)  Zithromax Z-Pak 250 Mg Tabs (Azithromycin) .... As Directed  Current Medications  (verified): 1)  Zyrtec Allergy 10 Mg  Tabs (Cetirizine Hcl) .... As Needed 2)  Diovan Hct 160-25 Mg  Tabs (Valsartan-Hydrochlorothiazide) .... Take 1 Tablet By Mouth Once A Day 3)  Lipitor 80 Mg  Tabs (Atorvastatin Calcium) .... Take One Tablet By Mouth At Bedtime 4)  Viagra 100 Mg  Tabs (Sildenafil Citrate) .... Take As Directed... 5)  Etodolac 400 Mg Tabs (Etodolac) .... Take 1 Tab By Mouth Two Times A Day As Needed For Arthritis Pain... 6)  Multivitamins   Tabs (Multiple Vitamin) .... Take 1 Tablet By Mouth Once A Day 7)  Freestyle Lite   Devi (Blood Glucose Monitoring Suppl) .... Use As Directed 8)  Freestyle Lancets   Misc (Lancets) .... Use As Directed 9)  Freestyle Lite Test   Strp (Glucose Blood) .... 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 he sees DrNesi for Urology yearly and last saw him about 2 weeks ago- doing well & PSA reported by pt= 0.90  Hx of OBSTRUCTIVE SLEEP APNEA (ICD-327.23) - eval in 1996 w/ sleep study showing RDI= 19, mod snoring, O2 sat to 90%... saw DrRedman for ENT due to snoring & tonsillar hypertrophy- offered UPPP surg but pt declined... since then he's gained weight, continued snoring w/o daytime hypersomn etc... not interested in further evaluation...   HYPERTENSION (ICD-401.9) - on DIOVAN/Hct 160-25 daily...   ~   baseline EKG showed NSR, WNL.Levi Lee.  ~  baseline CXR showed clear, NAD... DJD spine.  HYPERCHOLESTEROLEMIA (ICD-272.0) - on LIPITOR 80mg /d...  ~  FLP 5/08 (wt= 234#) showed TChol 158, TG 123, HDL 34, LDL 99  ~  FLP 7/09 (wt= 247#) showed TChol 165, TG 165, HDL 37, LDL 95... same med, better diet, lose wt.  ~  FLP 6/10 (wt= 241#) showed TChol 148, TG 74, HDL 44, LDL 90  OVERWEIGHT (ICD-278.02) - as above: we discussed diet, exercise, get weight down...  DIVERTICULOSIS OF COLON (ICD-562.10) & COLONIC POLYPS (ICD-211.3) - last colonoscopy 7/07 by DrStark showed 7mm polyp= tubular adenoma w/ f/u planned 85yrs...  BENIGN PROSTATIC HYPERTROPHY, HX OF (ICD-V13.8) - hx of elevated PSA w/ neg prostate bx's from [facility] in 2003 (PSA was 17)...  ~  PSA 5/08 was 0.66...  ~  PSA 7/09 is 0.52...  ~  Eval 6/10 DrNesi: doing well, PSA reported 0.90  DEGENERATIVE JOINT DISEASE (ICD-715.90) - he uses ETODOLAC as needed.    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  also c/o sinus pressure/congestion with green nasal drianage, PND causing sore throat x3days, would like a zpak. Is Patient Diabetic? No Comments Medications reviewed with patient Daytime contact number verified with patient. Boone Master CNA  September 22, 2009 10:31 AM    Physical Exam  Additional Exam:  WD, Overweight, 60  y/o BM in NAD... GENERAL:  Alert & oriented;  pleasant & cooperative... HEENT:  [place]/AT, EOM-wnl, PERRLA, .EACs-clear, TMs-wnl, NOSE-clear, THROAT-clear & wnl. NECK:  Supple w/ full ROM; no JVD; normal carotid impulses w/o bruits; no thyromegaly or nodules palpated; no lymphadenopathy. CHEST:  Clear to P & A; without wheezes/ rales/ or rhonchi heard... HEART:  Regular Rhythm; without murmurs/ rubs/ or gallops detected... ABDOMEN:  Soft & nontender; normal bowel sounds; no organomegaly or masses palpated... EXT: without deformities or arthritic changes; no varicose veins/ venous insuffic/ or edema. NEURO:  ; motor testing normal; sensory testing normal; gait normal & balance OK DERM:  No lesions noted; no rash etc... Muscu: tender along base of great toe right > left, skin smooth/intact on feet, no  swelling  or redness noted. pulses intact.      Impression & Recommendations:  Problem # 1:  UPPER RESPIRATORY INFECTION, ACUTE (ICD-465.9) zpack and mucinex dm  saline rinses as needed   Problem # 2:  DEGENERATIVE JOINT DISEASE (ICD-715.90) Bilateral feet pain ? etiology  will check labs w/ A1C and B12  tx w/ NSAIDS for arthiritic pain.   His updated medication list for this problem includes:    Etodolac 400 Mg Tabs (Etodolac) .Levi Lee... Take 1 tab by mouth two times a day as needed for arthritis pain...  Orders: TLB-B12 + Folate Pnl (82746_82607-B12/FOL) Est. Patient Level IV (04540)  Medications Added to Medication List This Visit: 1)  Zithromax Z-pak 250 Mg Tabs (Azithromycin) .... Take as directed.  Complete Medication List: 1)  Diovan Hct 160-25 Mg Tabs (Valsartan-hydrochlorothiazide) .... Take 1 tablet by mouth once a day 2)  Lipitor 80 Mg Tabs (Atorvastatin calcium) .... Take one tablet by mouth at bedtime 3)  Etodolac 400 Mg Tabs (Etodolac) .... Take 1 tab by mouth two times a day as needed for arthritis pain.Levi KitchenMarland Lee 4)  Multivitamins Tabs (Multiple vitamin) .... Take 1 tablet by mouth once a day 5)  Freestyle Lite Devi (Blood  glucose monitoring suppl) .... Use as directed 6)  Freestyle Lancets Misc (Lancets) .... Use as directed 7)  Freestyle Lite Test Strp (Glucose blood) .... Use as directed 8)  Zyrtec Allergy 10 Mg Tabs (Cetirizine hcl) .... As needed 9)  Viagra 100 Mg Tabs (Sildenafil citrate) .... Take as directed... 10)  Zithromax Z-pak 250 Mg Tabs (Azithromycin) .... Take as directed.  Other Orders: TLB-BMP (Basic Metabolic Panel-BMET) (80048-METABOL) TLB-A1C / Hgb A1C (Glycohemoglobin) (83036-A1C) TLB-Hepatic/Liver Function Pnl (80076-HEPATIC)  Patient Instructions: 1)  Zpack take as directed.  2)  Mucinex DM two times a day as needed cough/congesiton  3)  Saline nasal rinses as needed  4)  Increase fluids, rest and may use tylneol as needed  5)  Warm soaks to feet, elevation, support shoes.  6)  Avoid standing for prolonged period,  7)  Advil 200mg  3 tabs two times a day w/ food for 5 days 8)  Please contact office for sooner follow up if symptoms do not improve or worsen  9)  follow up Dr. Kriste Basque for CPX in 3 months  Prescriptions: ZITHROMAX Z-PAK 250 MG TABS (AZITHROMYCIN) take as directed.  #1 x 0   Entered and Authorized by:   Rubye Oaks NP   Signed by:   Rubye Oaks NP on 09/22/2009   Method used:   Electronically to        [facility]  [phone]* (retail)       [address]       [place], [place]  [address]       Ph: [phone] or [phone]       Fax: [phone]   RxID:   [id number]    Immunization History:  Influenza Immunization History:    Influenza:  declines (09/22/2009)  Pneumovax Immunization History:    Pneumovax:  declines (09/22/2009)

## 2010-08-30 NOTE — Progress Notes (Signed)
Summary: diabetic supplies  Phone Note Call from Patient Call back at Home Phone [phone]   Caller: Spouse Call For: nadel Summary of Call: per spouse- pt needs 90 days supply: testing strips and lancets. [facility].  Initial call taken by: Tivis Ringer,  August 10, 2009 5:15 PM  Follow-up for Phone Call        Rxs were sent to [facility].  Spoke with pt and made aware. Follow-up by: Vernie Murders,  August 10, 2009 5:19 PM    Prescriptions: FREESTYLE LITE TEST   STRP (GLUCOSE BLOOD) use as directed  #100 x 6   Entered by:   Vernie Murders   Authorized by:   Michele Mcalpine MD   Signed by:   Vernie Murders on 08/10/2009   Method used:   Electronically to        [facility]* (mail-order)             ,          Ph: [phone]       Fax: [phone]   RxID:   [id number] FREESTYLE LANCETS   MISC (LANCETS) use as directed  #100 x 6   Entered by:   Vernie Murders   Authorized by:   Michele Mcalpine MD   Signed by:   Vernie Murders on 08/10/2009   Method used:   Electronically to        [facility]* (mail-order)             ,          Ph: [phone]       Fax: [phone]   RxID:   [id number]

## 2010-08-30 NOTE — Progress Notes (Signed)
Summary: rx request  Phone Note Call from Patient   Caller: Patient Call For: nadel Summary of Call: has tried celebrex. works well. requests rx for this. [facility] [address]. cell [phone] Initial call taken by: Tivis Ringer, CNA,  March 12, 2010 1:01 PM  Follow-up for Phone Call        Marliss Czar, will you please ask SN if okay to give RX;thanks.Reynaldo Minium CMA  March 12, 2010 2:58 PM    per SN---insurance may not pay for this but we will be happy to call this in for him---celebrex 200mg   #30  1 by mouth once daily refill x 5.  thanks Randell Loop CMA  March 12, 2010 3:04 PM   Additional Follow-up for Phone Call Additional follow up Details #1::        Called, spoke with pt.  Pt informed of above per SN and aware celebrex rx sent to [facility].  He verbalized understanding. Pt also has another question for SN---states he is having cramps from lipitor.  Would like to know if there is another med he could try.  Dr. Kriste Basque, pls advise.  Thanks! Additional Follow-up by: Gweneth Dimitri RN,  March 12, 2010 3:09 PM    Additional Follow-up for Phone Call Additional follow up Details #2::    Per SN- Crestor 5mg  #30 take 1 by mouth once daily with as needed refills.Reynaldo Minium CMA  March 12, 2010 5:14 PM   Pt aware of new RX and knows its been sent to pharmacy.Reynaldo Minium CMA  March 12, 2010 5:15 PM   New/Updated Medications: CRESTOR 5 MG TABS (ROSUVASTATIN CALCIUM) take 1 by mouth once daily CELEBREX 200 MG CAPS (CELECOXIB) Take 1 capsule by mouth once a day Prescriptions: CRESTOR 5 MG TABS (ROSUVASTATIN CALCIUM) take 1 by mouth once daily  #30 x 11   Entered by:   Reynaldo Minium CMA   Authorized by:   Michele Mcalpine MD   Signed by:   Reynaldo Minium CMA on 03/12/2010   Method used:   Electronically to        [facility]  [phone]* (retail)       [address]       [place], [place]  [address]       Ph: [phone] or  [phone]       Fax: [phone]   RxID:   [id number] CELEBREX 200 MG CAPS (CELECOXIB) Take 1 capsule by mouth once a day  #30 x 5   Entered by:   Gweneth Dimitri RN   Authorized by:   Michele Mcalpine MD   Signed by:   Gweneth Dimitri RN on 03/12/2010   Method used:   Electronically to        [facility]  [phone]* (retail)       [address]       [place], [place]  [address]       Ph: [phone] or [phone]       Fax: [phone]   RxID:   [id number]

## 2010-08-30 NOTE — Progress Notes (Signed)
Summary: RESULTS  Phone Note Call from Patient Call back at Home Phone [phone]   Caller: Spouse Call For: NADEL Summary of Call: SPOUSE REQUESTS TO HAVE PT'S RESULTS MAILED TO THEIR HOME ADDRESS (WHICH I HAVE VERIFIED). THANKS.  Initial call taken by: Tivis Ringer, CNA,  January 25, 2010 3:51 PM  Follow-up for Phone Call        Results of labs and cxr were mailed to pt's home. Follow-up by: Vernie Murders,  January 25, 2010 4:26 PM

## 2010-11-05 ENCOUNTER — Other Ambulatory Visit: Payer: Self-pay | Admitting: Pulmonary Disease

## 2010-11-05 DIAGNOSIS — Z0000 Encounter for general adult medical examination without abnormal findings: Secondary | ICD-10-CM

## 2010-11-05 DIAGNOSIS — Z87898 Personal history of other specified conditions: Secondary | ICD-10-CM

## 2010-11-06 ENCOUNTER — Other Ambulatory Visit (INDEPENDENT_AMBULATORY_CARE_PROVIDER_SITE_OTHER): Payer: Federal, State, Local not specified - PPO | Admitting: Pulmonary Disease

## 2010-11-06 ENCOUNTER — Encounter: Payer: Self-pay | Admitting: Pulmonary Disease

## 2010-11-06 ENCOUNTER — Other Ambulatory Visit (INDEPENDENT_AMBULATORY_CARE_PROVIDER_SITE_OTHER): Payer: Federal, State, Local not specified - PPO

## 2010-11-06 ENCOUNTER — Ambulatory Visit (INDEPENDENT_AMBULATORY_CARE_PROVIDER_SITE_OTHER): Payer: Federal, State, Local not specified - PPO | Admitting: Pulmonary Disease

## 2010-11-06 DIAGNOSIS — Z8639 Personal history of other endocrine, nutritional and metabolic disease: Secondary | ICD-10-CM

## 2010-11-06 DIAGNOSIS — E7800 Pure hypercholesterolemia, unspecified: Secondary | ICD-10-CM

## 2010-11-06 DIAGNOSIS — Z87898 Personal history of other specified conditions: Secondary | ICD-10-CM

## 2010-11-06 DIAGNOSIS — E785 Hyperlipidemia, unspecified: Secondary | ICD-10-CM

## 2010-11-06 DIAGNOSIS — E663 Overweight: Secondary | ICD-10-CM

## 2010-11-06 DIAGNOSIS — R7309 Other abnormal glucose: Secondary | ICD-10-CM

## 2010-11-06 DIAGNOSIS — G4733 Obstructive sleep apnea (adult) (pediatric): Secondary | ICD-10-CM

## 2010-11-06 DIAGNOSIS — I10 Essential (primary) hypertension: Secondary | ICD-10-CM

## 2010-11-06 DIAGNOSIS — Z0000 Encounter for general adult medical examination without abnormal findings: Secondary | ICD-10-CM

## 2010-11-06 LAB — BASIC METABOLIC PANEL
BUN: 18 mg/dL (ref 6–23)
CO2: 30 mEq/L (ref 19–32)
Chloride: 101 mEq/L (ref 96–112)
Glucose, Bld: 132 mg/dL — ABNORMAL HIGH (ref 70–99)
Potassium: 4.8 mEq/L (ref 3.5–5.1)
Sodium: 139 mEq/L (ref 135–145)

## 2010-11-06 LAB — CBC WITH DIFFERENTIAL/PLATELET
Basophils Absolute: 0 10*3/uL (ref 0.0–0.1)
Eosinophils Relative: 0.6 % (ref 0.0–5.0)
Lymphs Abs: 1.7 10*3/uL (ref 0.7–4.0)
Monocytes Absolute: 0.4 10*3/uL (ref 0.1–1.0)
Monocytes Relative: 11.6 % (ref 3.0–12.0)
Neutrophils Relative %: 40.3 % — ABNORMAL LOW (ref 43.0–77.0)
Platelets: 159 10*3/uL (ref 150.0–400.0)
RDW: 12.6 % (ref 11.5–14.6)
WBC: 3.7 10*3/uL — ABNORMAL LOW (ref 4.5–10.5)

## 2010-11-06 LAB — HEPATIC FUNCTION PANEL
ALT: 50 U/L (ref 0–53)
AST: 30 U/L (ref 0–37)
Albumin: 4 g/dL (ref 3.5–5.2)
Alkaline Phosphatase: 57 U/L (ref 39–117)

## 2010-11-06 LAB — URINALYSIS
Hgb urine dipstick: NEGATIVE
Ketones, ur: NEGATIVE
Leukocytes, UA: NEGATIVE
Specific Gravity, Urine: >=1.03 (ref 1.000–1.030)
Urine Glucose: NEGATIVE
Urobilinogen, UA: 0.2 (ref 0.0–1.0)

## 2010-11-06 LAB — PSA: PSA: 0.68 ng/mL (ref 0.10–4.00)

## 2010-11-06 LAB — LIPID PANEL: HDL: 41.2 mg/dL (ref >39.00)

## 2010-11-06 LAB — LDL CHOLESTEROL, DIRECT: Direct LDL: 175.6 mg/dL

## 2010-11-06 LAB — TSH: TSH: 1.36 u[IU]/mL (ref 0.35–5.50)

## 2010-11-06 MED ORDER — ROSUVASTATIN CALCIUM 20 MG PO TABS: 20.0000 mg | ORAL_TABLET | Freq: Every day | ORAL | Status: DC

## 2010-11-06 MED ORDER — METFORMIN HCL ER 500 MG PO TB24: 500.0000 mg | ORAL_TABLET | Freq: Every day | ORAL | Status: DC

## 2010-11-06 NOTE — Patient Instructions (Signed)
Today we updated your med list...    We removed the Lipitor you stopped several months ago...    We decided to add a lower dose of CRESTOR 20mg  - take 1 tab daily...    We also added a new Diabetic med- METFORMIN ER 500mg  take one tab in the AM...  You need a better low carb/ low fat diet & the bottom line is losing some weight (shoot for 15-20 lbs initially)...  We reviewed your recent blood work & gave you a copy for your records...  Call for any problems... Let's plana follow up visit in 6 months w/ repeat labs.Marland KitchenMarland Kitchen

## 2010-11-08 ENCOUNTER — Telehealth: Payer: Self-pay | Admitting: Pulmonary Disease

## 2010-11-08 MED ORDER — ETODOLAC 400 MG PO TABS: 400.0000 mg | ORAL_TABLET | Freq: Two times a day (BID) | ORAL | Status: DC

## 2010-11-08 MED ORDER — METFORMIN HCL ER 500 MG PO TB24: 500.0000 mg | ORAL_TABLET | Freq: Every day | ORAL | Status: DC

## 2010-11-08 MED ORDER — VALSARTAN-HYDROCHLOROTHIAZIDE 160-25 MG PO TABS: 1.0000 | ORAL_TABLET | Freq: Every day | ORAL | Status: DC

## 2010-11-08 MED ORDER — ROSUVASTATIN CALCIUM 20 MG PO TABS: 20.0000 mg | ORAL_TABLET | Freq: Every day | ORAL | Status: DC

## 2010-11-08 NOTE — Telephone Encounter (Signed)
Pt request crstor, metformin, diovan and etodolac be sent to [facility] for 3 month supply. RX sent and pt is aware.

## 2010-11-08 NOTE — Telephone Encounter (Signed)
Pt stated when he was seen on 4/10 he gave the nurse the information he has changed insurance and needs new scripts for all of his medications sent to [facility]. He wants to confirm that this has been taken care of and can be reached at [phone] also stated that Dr Kriste Basque added two new medications to his list.

## 2010-11-15 ENCOUNTER — Telehealth: Payer: Self-pay | Admitting: Pulmonary Disease

## 2010-11-15 MED ORDER — ROSUVASTATIN CALCIUM 20 MG PO TABS: 20.0000 mg | ORAL_TABLET | Freq: Every day | ORAL | Status: DC

## 2010-11-15 MED ORDER — VALSARTAN-HYDROCHLOROTHIAZIDE 160-25 MG PO TABS: 1.0000 | ORAL_TABLET | Freq: Every day | ORAL | Status: DC

## 2010-11-15 MED ORDER — METFORMIN HCL ER 500 MG PO TB24: 500.0000 mg | ORAL_TABLET | Freq: Every day | ORAL | Status: DC

## 2010-11-15 MED ORDER — ETODOLAC 400 MG PO TABS: 400.0000 mg | ORAL_TABLET | Freq: Two times a day (BID) | ORAL | Status: DC

## 2010-11-15 NOTE — Telephone Encounter (Signed)
Spoke with pt's spouse.  She states that rxs were not received by [facility] on 11/08/10 so I resent them electronically.

## 2010-11-25 ENCOUNTER — Encounter: Payer: Self-pay | Admitting: Pulmonary Disease

## 2010-11-25 NOTE — Progress Notes (Signed)
Subjective:    Patient ID: Levi Lee, male    DOB: [birth year], 60 y.o.   MRN: [id number]  HPI 60 y/o BM here for a folllow up visit... He has mult medical problems as noted below>   ~  January 10, 2010:  Yearly ROV doing satis & his CC is paresthesias in toes> could be early neuropathy w/ his mild gluc intol & offered Lyrica trial but it's not that bad & he wants to watch it... he notes rare episodes of self-limited palpit> rapid regular & review of hx shows episode of PAT yrs ago Rx in ER w/ "shot" converted to NSR... we discussed no caffeine etc & he will let us know if symptoms worsen...  ~  November 06, 2010:  65mo ROV and he reports doing satis w/o new complaints or concerns> he stpped his Lipitor80 about 3 months ago he says due to cramping in his hands & feet (resolved off the med), he is willing to consider a diff med if necessary based on  todays fasting blood work (Chol=267 & rec to try Cres20);  BP controlled on med but unfortunately he has not lost any wt> we reviewed diet & exercise prescription...  FBS remains elevated & A1c=7.3 therefore start MetforminER 500mg Qam; he really needs to do better & he understands the risks...         Problem List:  Health Maintenance>   he is 60 y/o... ex-smoker, quit ~58yrs... he sees DrNesi for Urology yearly... he had Pneumovax in 31 (age 71)...  Hx of OBSTRUCTIVE SLEEP APNEA (ICD-327.23) - eval in 1996 w/ sleep study showing RDI= 19, mod snoring, O2 sat to 90%... saw DrRedman for ENT due to snoring & tonsillar hypertrophy- offered UPPP surg but pt declined... since then he's gained weight, continued snoring w/o daytime hypersom etc... not interested in further evaluation. ~  4/12:  Pt states that wife does c/o his snoring but he states he rests well, wakes refreshed, no daytime symptoms...  HYPERTENSION (ICD-401.9) - on DIOVAN/Hct 160-25 daily... ~  baseline EKG showed NSR, WNL.[name]. ~  baseline CXR showed clear, NAD; DJD spine. ~  4/12:  BP= 136/82  today & even better at home ave= 120's/ 80's he says; denies HA, fatigue, visual changes, CP, palipit, dizziness, syncope, dyspnea, edema, etc...  HYPERCHOLESTEROLEMIA (ICD-272.0) - prev on Lip80, pt stopped on his own 1/12 due to cramping (resolved off the med);  NOTE: he had mild incr LFTs as well while on the Lip80 & these ret to normal off the med. ~  FLP 7/09 on Lip80 showed TChol 165, TG 165, HDL 37, LDL 95... same med, better diet, lose wt. ~  FLP 6/10 on Lip80 showed TChol 148, TG 74, HDL 44, LDL 90 ~  FLP 6/11 on Lip80 showed TChol 180, TG 148, HDL 43, LDL 108... needs to get wt down! ~  FLP 4/12 on diet alone showed TChol 257, TG 202, HDL41, LDL176... rec to start Crestor 20mg /d.  OTHER ABNORMAL GLUCOSE (ICD-790.29) - hx elev FBS in past> diet Rx... ~  labs 6/10 showed BS= 111, A1c= 6.4 ~  labs 6/11 showed BS= 148, A1c= 6.6.Marland KitchenMarland Kitchen may need meds, get weight down. ~  labd 4/12 on diet alone showed BS= 132, A1c= 7.3.Marland KitchenMarland Kitchen rec to start MetforminER 500mg Qam.  OVERWEIGHT (ICD-278.02) - as above: we discussed diet, exercise, get weight down... ~  weight 7/09 = 247# ~  weight 6/10 = 241# ~  weight 6/11 = 240# ~  Weight 4/12 =  244#  DIVERTICULOSIS OF COLON (ICD-562.10) & COLONIC POLYPS (ICD-211.3) ~  colonoscopy 7/07 by DrStark showed 7mm polyp= tubular adenoma... ~  colonoscopy 11/10 showed divertics, polyp= tub adenoma, +hems... f/u planned 76yrs.  LIVER FUNCTION TESTS, ABNORMAL, HX OF (ICD-V12.2) - could be Statin related vs Steatosis... ~  labs 6/10 showed AlkPhos=71, SGOT=47, SGPT=65 ~  labs 6/11 showed AlkPhos=61, SGOT=39, SGPT=57 ~  Labs 4/12 off statin Rx showed SGOT=30, SGPT=50  BENIGN PROSTATIC HYPERTROPHY, HX OF (ICD-V13.8) - hx of elevated PSA w/ neg prostate bx's from [facility] in 2003 (PSA was 17)... ~  PSA 5/08 was 0.66... ~  PSA 7/09 is 0.52... ~  Eval 6/10 DrNesi: doing well, PSA reported 0.90 ~  labs 6/11 showed PSA= 0.59 ~  Labs 4/12 showed PSA= 0.68  DEGENERATIVE JOINT  DISEASE (ICD-715.90) - he uses ETODOLAC as needed.   Past Surgical History  Procedure Date  . Vasectomy     by Dr. Brunilda Payor    Outpatient Encounter Prescriptions as of 11/06/2010  Medication Sig Dispense Refill  . cetirizine (ZYRTEC) 10 MG tablet Take 10 mg by mouth daily.        [name] glucose blood test strip 1 each by Other route as needed. Use as instructed       . Lancets (FREESTYLE) lancets 1 each by Other route as needed. Use as instructed       . multivitamin (THERAGRAN) per tablet Take 1 tablet by mouth daily.        . sildenafil (VIAGRA) 100 MG tablet Take 100 mg by mouth daily as needed.        .  etodolac (LODINE) 400 MG tablet Take 400 mg by mouth 2 (two) times daily. As needed for arthritis pain       .  valsartan-hydrochlorothiazide (DIOVAN-HCT) 160-25 MG per tablet Take 1 tablet by mouth daily.        [name] DISCONTD: atorvastatin (LIPITOR) 80 MG tablet Take 80 mg by mouth daily.    ==> pt stopped this med ~1/12    .  metFORMIN (GLUCOPHAGE XR) 500 MG 24 hr tablet Take 1 tablet (500 mg total) by mouth daily with breakfast.  30 tablet  11  .  rosuvastatin (CRESTOR) 20 MG tablet Take 1 tablet (20 mg total) by mouth at bedtime.  30 tablet  11    No Known Allergies   Review of Systems       The patient complains of some arthritis.  The patient denies fever, chills, sweats, anorexia, fatigue, weakness, malaise, weight loss, sleep disorder, blurring, diplopia, eye irritation, eye discharge, vision loss, eye pain, photophobia, earache, ear discharge, tinnitus, decreased hearing, nasal congestion, nosebleeds, sore throat, hoarseness, chest pain, syncope, dyspnea on exertion, orthopnea, PND, peripheral edema, cough, dyspnea at rest, excessive sputum, hemoptysis, wheezing, pleurisy, nausea, vomiting, diarrhea, constipation, change in bowel habits, abdominal pain, melena, hematochezia, jaundice, gas/bloating, indigestion/heartburn, dysphagia, odynophagia, dysuria, hematuria, urinary frequency,  urinary hesitancy, nocturia, incontinence, back pain, joint pain, joint swelling, muscle cramps, muscle weakness, stiffness, sciatica, restless legs, leg pain at night, leg pain with exertion, rash, itching, dryness, suspicious lesions, paralysis, paresthesias, seizures, tremors, vertigo, transient blindness, frequent falls, frequent headaches, difficulty walking, depression, anxiety, memory loss, confusion, cold intolerance, heat intolerance, polydipsia, polyphagia, polyuria, unusual weight change, abnormal bruising, bleeding, enlarged lymph nodes, urticaria, allergic rash, hay fever, and recurrent infections.   Objective:   Physical Exam     WD, Overweight, 59 y/o BM in NAD... Vital Signs:  Reviewed w/ pt... GENERAL:  Alert & oriented; pleasant & cooperative... HEENT:  [place]/AT, EOM-wnl, PERRLA, EACs-clear, TMs-wnl, NOSE-clear, THROAT-clear & wnl. NECK:  Supple w/ full ROM; no JVD; normal carotid impulses w/o bruits; no thyromegaly or nodules palpated; no lymphadenopathy. CHEST:  Clear to P & A; without wheezes/ rales/ or rhonchi heard... HEART:  Regular Rhythm; without murmurs/ rubs/ or gallops detected... ABDOMEN:  Soft & nontender; normal bowel sounds; no organomegaly or masses palpated... EXT: without deformities or arthritic changes; no varicose veins/ venous insuffic/ or edema. NEURO:  CN's intact; motor testing normal; sensory testing normal; gait normal & balance OK DERM:  No lesions noted; no rash etc...   Assessment & Plan:   OSA>  Stable, denies sleep disordered breathing other than snoring; wakes refreshed, no daytime hypersom etc...  HBP>  Controlled on Diovan/HCT;  Continue same meds, needs better diet, get wt down!  CHOL>  Lipids back up off the Lipitor (caused cramps in hands & feet, and sl incr LFTs);  He is willing to try Crestor 20mg /d...  DM>  FBS now 132 & A1c is 7.3;  He hasn't been able to effectively diet or exercise;  He needs to lose a substantiual amt of weight &  we discussed 10-15 lb increments; in the meanwhile> start MetforminER 500mg  Qam...  Other medical problems as noted.Marland KitchenMarland Kitchen

## 2010-12-13 ENCOUNTER — Telehealth: Payer: Self-pay | Admitting: Pulmonary Disease

## 2010-12-13 MED ORDER — GLUCOSE BLOOD VI STRP: ORAL_STRIP | Status: AC

## 2010-12-13 MED ORDER — FREESTYLE LANCETS MISC: Status: DC

## 2010-12-13 NOTE — Telephone Encounter (Signed)
Spoke with pt's spouse and notified rx for lancets and strips sent to pharmacy. She verbalized understanding.

## 2010-12-14 NOTE — Assessment & Plan Note (Signed)
[facility]                              CARDIOLOGY OFFICE NOTE   LESTON, SCHUELLER                           MRN:          [id number]  DATE:03/20/2006                            DOB:          1950/08/18    PAST MEDICAL HISTORY:  1. Hyperlipidemia.  2. Hypertension.  3. No diabetes or coronary artery disease.  No family history for coronary      artery risk factors and no tobacco abuse.   MEDICATIONS:  1. Lipitor 80 mg daily.  2. Diovan/hydrochlorothiazide 160/25 mg daily.  3. Adalat 400 mg twice daily.  4. Multivitamin daily.   PHYSICAL EXAMINATION:  VITAL SIGNS:  Weight 271 pounds, blood pressure  118/70, heart rate 70.   LABORATORY DATA AND X-RAY FINDINGS:  Total cholesterol 222, triglycerides  89, HDL 41, LDL 79, while on Lipitor 20.   ASSESSMENT:  Levi Lee is a pleasant, 60 year old gentleman who comes to  clinic today accompanied by his wife.  He has no chest pain, no shortness of  breath, no muscle aches or pains.  He had previously suffered from leg pains  which he thought was in regards to Lipitor 20 mg, which he had been on for a  lengthy period of time.  He stopped the Lipitor and there is no change in  the leg pains, however, after doing some stretching exercises, drinking more  water and eating a healthier diet, he said his leg pains did subside.  He  attributes them to more excess exercise and walking the golf course than to  the Lipitor.  At his last visit with primary physician, Dr. Kriste Basque, he  restarted his Lipitor, increased the dose of Lipitor to 80 mg daily and  restarted that 2 weeks ago.  He so far is tolerating that well with no  muscle aches or pains in his legs.  He is not having any chest pain,  shortness of breath and is compliant with current medication therapy.  He  exercises by walking 2 miles every day, Monday thru Friday, in the morning  before work.  He also golfs once or twice a week.  In the high heat, he  rides a golf cart, but if it is not excessively hot, he does walk the  course.  As far as diet, he eats a lot of fried food.  He eats a sausage  patty for breakfast every day.  He eats lunch out four to five times a week  which is mainly fried chicken wings, fried fish, fried chicken, etc., with  french fries, macaroni and cheese with a high amount of carbohydrates and  fat.  He has a healthy evening meal which his wife prepares, except that he  does a lot of snacking at home.  He has a beer with potato chips and peanuts  when he first arrives home.  After his evening meal, he has at least one to  two more scotches at night while he was also snacking on chips, cookies,  peanuts, etc.   PLAN:  1. Continue Lipitor 80 mg  daily.  2. Continue exercise.  3. Decrease fats in his diet.  We had a lengthy discussion regarding diet      and lifestyle modification.  He is amenable to decreasing his fried      food to three times a week, decreasing his scotch to one a night and      smaller portion sizes of chips and peanuts.  4. Followup visit in 3 months for lipid panel, LFTs and total CK.  Make      any medication changes at that time to obtain a goal LDL of less than      130, but as close to 100 as possible.                                  Leota Sauers, PharmD                            Jesse Sans. Daleen Squibb, MD, [facility]   LC/MedQ  DD:  03/20/2006  DT:  03/21/2006  Job #:  [id number]

## 2010-12-14 NOTE — Assessment & Plan Note (Signed)
[facility]                               LIPID CLINIC NOTE   ZARIUS, FURR                           MRN:          [id number]  DATE:06/26/2006                            DOB:          March 09, 1951    RETURN OFFICE VISIT FOR LIPID CLINIC:   PAST MEDICAL HISTORY:  1. Hyperlipidemia.  2. Hypertension.   MEDICATIONS:  1. Lipitor 80 mg daily.  2. Multivitamin daily.  3. Diovan/hydrochlorothiazide 160/25 mg daily.  4. Etodolac 400 mg twice daily as needed for pain.   VITAL SIGNS:  Weight 245 pounds.  Blood pressure 122/70.  Heart rate 76.   LABORATORY DATA:  Total cholesterol 152, triglycerides 77, HDL 38, LDL  99.  AST 42, ALT 73.   ASSESSMENT:  Mr. Cullinane is a very pleasant 60 year old gentleman who  returns to lipid clinic today accompanied by his wife.  He has no chest  pain, no shortness of breath, no muscle aches or pains.  He is compliant  with current medication regimen.  He is also very compliant with his  exercise regimen, where he walks 2-1/2 miles about 5 times a week and  golfs twice a week, and his wife agrees that he is very compliant with  his exercise regimen.  What he is noncompliant with is a low-fat diet.  At last visit he was eating fried food almost 5 to 6 times a week for  his lunch.  He rarely ate breakfast.  He would describe something,  peanut butter crackers, etc., to go, and was drinking a lot of soda.  His only healthy meal was his evening meal that his wife provided.  Since that time he has made very little changes to his high-fat fried  food lunches; however, he has started eating a small bowl of Cheerios in  the morning for breakfast and decreasing the amount of sodas.  He goes  to various restaurants during the week for lunch but his choices are  always something fried, whether it be fried fish or hamburgers with  [place] fries, fried onion rings, etc.  We spent a lengthy time  discussing the benefits of low-fat  diet and picking alternatives for him  to eat on a daily basis as well as the limit of his fried food.  He is  agreeable to eating fried fish once a week and a burger once a week with  half of the [place] fries he had previously been eating, and the other 3  days of the work week he is willing to eat grilled chicken and/or  salads, low-fat lunch meats such as [place] and ham but no bologna,  salami, pepperoni, etc., and use whole wheat bread.  Of note, he does  have a substantial amount of alcohol intake on a daily basis.  He drinks  a beer when he gets home from work and then two scotches or two vodkas,  etc., in the evening after his dinner.  He also drinks while watching  football Saturday and Sunday, anywhere from 2 to 4, but denies that  he  has much more than 4 drinks while watching football on the weekend.  Note that his LFTs have bumped slightly since last visit, either drug-  induced from Lipitor versus alcohol-induced.  We will continue to keep a  close eye on this.  In light of him not having any myalgias or any other  issues regarding his statin therapy, we will continue that for now and  monitor his LFTs.  I have also encouraged him to decrease his alcohol  intake to no more than 2 drinks a day.  Lipid panel is at goal.  We will  not make any adjustments at this time.   PLAN:  1. Continue current medication therapy.  2. Continue exercise.  3. Decrease fats in diet.  4. Decrease alcohol intake.  5. Follow-up visit in 6 months for a lipid panel and LFTs, make      adjustments at that time.      Leota Sauers, PharmD  Electronically Signed      Jesse Sans. Daleen Squibb, MD, [facility]  Electronically Signed   LC/MedQ  DD: 06/26/2006  DT: 06/27/2006  Job #: [id number]

## 2011-03-05 ENCOUNTER — Other Ambulatory Visit: Payer: Self-pay | Admitting: Orthopedic Surgery

## 2011-03-05 DIAGNOSIS — M25511 Pain in right shoulder: Secondary | ICD-10-CM

## 2011-03-07 ENCOUNTER — Ambulatory Visit
Admission: RE | Admit: 2011-03-07 | Discharge: 2011-03-07 | Disposition: A | Discharge status: Home / Self Care | Payer: Federal, State, Local not specified - PPO | Source: Ambulatory Visit | Attending: Orthopedic Surgery | Admitting: Orthopedic Surgery

## 2011-03-07 DIAGNOSIS — M25511 Pain in right shoulder: Secondary | ICD-10-CM

## 2011-04-10 ENCOUNTER — Ambulatory Visit (INDEPENDENT_AMBULATORY_CARE_PROVIDER_SITE_OTHER): Payer: Federal, State, Local not specified - PPO | Admitting: Pulmonary Disease

## 2011-04-10 ENCOUNTER — Other Ambulatory Visit (INDEPENDENT_AMBULATORY_CARE_PROVIDER_SITE_OTHER): Payer: Federal, State, Local not specified - PPO

## 2011-04-10 ENCOUNTER — Other Ambulatory Visit: Payer: Self-pay | Admitting: Pulmonary Disease

## 2011-04-10 VITALS — BP 110/70 | HR 80 | Temp 98.1°F | Ht 71.0 in | Wt 239.0 lb

## 2011-04-10 DIAGNOSIS — N410 Acute prostatitis: Secondary | ICD-10-CM

## 2011-04-10 DIAGNOSIS — I10 Essential (primary) hypertension: Secondary | ICD-10-CM

## 2011-04-10 DIAGNOSIS — Z87898 Personal history of other specified conditions: Secondary | ICD-10-CM

## 2011-04-10 DIAGNOSIS — M1990 Unspecified osteoarthritis, unspecified site: Secondary | ICD-10-CM

## 2011-04-10 DIAGNOSIS — E7800 Pure hypercholesterolemia, unspecified: Secondary | ICD-10-CM

## 2011-04-10 DIAGNOSIS — E119 Type 2 diabetes mellitus without complications: Secondary | ICD-10-CM

## 2011-04-10 DIAGNOSIS — E663 Overweight: Secondary | ICD-10-CM

## 2011-04-10 LAB — URINALYSIS, ROUTINE W REFLEX MICROSCOPIC
Ketones, ur: NEGATIVE
Specific Gravity, Urine: >=1.03 (ref 1.000–1.030)
Total Protein, Urine: NEGATIVE
Urine Glucose: NEGATIVE
Urobilinogen, UA: 0.2 (ref 0.0–1.0)

## 2011-04-10 LAB — BASIC METABOLIC PANEL
BUN: 30 mg/dL — ABNORMAL HIGH (ref 6–23)
Calcium: 9.7 mg/dL (ref 8.4–10.5)
Chloride: 99 mEq/L (ref 96–112)
Creatinine, Ser: 1.3 mg/dL (ref 0.4–1.5)
GFR: 73.21 mL/min (ref >60.00)

## 2011-04-10 MED ORDER — TAMSULOSIN HCL 0.4 MG PO CAPS: 0.4000 mg | ORAL_CAPSULE | Freq: Every day | ORAL | Status: DC

## 2011-04-10 MED ORDER — SULFAMETHOXAZOLE-TRIMETHOPRIM 800-160 MG PO TABS: ORAL_TABLET | ORAL | Status: DC

## 2011-04-10 NOTE — Patient Instructions (Signed)
Today we updated your med list in EPIC...    We decided to treat your Prostate gland w/ 2 meds>    >SEPTRA DS one tab twice daily for 10d to eliminate any infection...    >TAMSULOSIN 0.4mg  daily at bedtime for the BPH...  Today we did your follow up blood work & urine...    Please call the [facility] in a few days for your results...    Dial [id number] & when prompted enter your patient number followed by the # symbol...    Your patient number is:  [id number]#  Let's cancel the follow up appt you made for Oct & reschedule this for January>     We will plan FASTING blood work at that time & recheck your Cholesterol on the Crestor.Marland KitchenMarland Kitchen

## 2011-04-10 NOTE — Progress Notes (Signed)
Subjective:    Patient ID: Levi Lee, male    DOB: Dec 06, 1950, 60 y.o.   MRN: [id number]  HPI 60 y/o BM here for a folllow up visit... He has mult medical problems as noted below>   ~  January 10, 2010:  Yearly ROV doing satis & his CC is paresthesias in toes> could be early neuropathy w/ his mild gluc intol & offered Lyrica trial but it's not that bad & he wants to watch it... he notes rare episodes of self-limited palpit> rapid regular & review of hx shows episode of PAT yrs ago Rx in ER w/ "shot" converted to NSR... we discussed no caffeine etc & he will let us know if symptoms worsen...  ~  November 06, 2010:  37mo ROV and he reports doing satis w/o new complaints or concerns> he stpped his Lipitor80 about 3 months ago he says due to cramping in his hands & feet (resolved off the med), he is willing to consider a diff med if necessary based on  todays fasting blood work (Chol=267 & rec to try Cres20);  BP controlled on med but unfortunately he has not lost any wt> we reviewed diet & exercise prescription...  FBS remains elevated & A1c=7.3 therefore start MetforminER 500mg Qam; he really needs to do better & he understands the risks...  ~  April 10, 2011:  27mo ROV & add-on for urinary symptoms> c/o 3-4d hx slow stream, hesitant w/ freq 4-5 days, 2-3 nights but he denies incont, dysuria, fever, etc;  He has hx BPH & has seen DrNesi in past w/ elev PSA & neg bx in 2003; We discussed checking UA and Cult (+EColi- pan sens) and treating w/ empiric SeptraDS until cult avail...    HBP controlled on DiovanHCT w/ BP= 110/70 today & he denies CP, palpit, SOB, edema, etc...    Chol was at goal on Lip80 but he stopped 1/12 due to muscles cramps which resolved off the Rx; f/u FLP 4/12 looked terrible & he was placed on Cres20; f/u FLP on Cres20 is pending...    DM treated w/ diet, exercise, & MetforminER 500mg /d started 4/12> he notes home BS ave ~130 & labs today showed BS=151, A1c=7.1 improved from 7.3;  continue same med, get on diet, get wt down...         Problem List:  Hx of OBSTRUCTIVE SLEEP APNEA (ICD-327.23) - eval in 1996 w/ sleep study showing RDI= 19, mod snoring, O2 sat to 90%... saw DrRedman for ENT due to snoring & tonsillar hypertrophy- offered UPPP surg but pt declined... since then he's gained weight, continued snoring w/o daytime hypersom etc... not interested in further evaluation. ~  4/12:  Pt states that wife does c/o his snoring but he states he rests well, wakes refreshed, no daytime symptoms...  HYPERTENSION (ICD-401.9) - on DIOVAN/Hct 160-25 daily... ~  baseline EKG showed NSR, WNL.[name]. ~  baseline CXR showed clear, NAD; DJD spine.  HYPERCHOLESTEROLEMIA (ICD-272.0) - prev on Lip80, pt stopped on his own 1/12 due to cramping (resolved off the med);  NOTE: he had mild incr LFTs as well while on the Lip80 & these ret to normal off the med. ~  FLP 7/09 on Lip80 showed TChol 165, TG 165, HDL 37, LDL 95... same med, better diet, lose wt. ~  FLP 6/10 on Lip80 showed TChol 148, TG 74, HDL 44, LDL 90 ~  FLP 6/11 on Lip80 showed TChol 180, TG 148, HDL 43, LDL 108... needs to get wt  down! ~  FLP 4/12 on diet alone showed TChol 257, TG 202, HDL41, LDL176... rec to start Crestor 20mg /d. ~  9/12 OV ==> not fasting for FLP today  OTHER ABNORMAL GLUCOSE (ICD-790.29) - hx elev FBS in past> diet Rx; started on METFORMIN  ER 500mg Qam 4/12 w/ A1c up to 7.3 ~  labs 6/10 showed BS= 111, A1c= 6.4 ~  labs 6/11 showed BS= 148, A1c= 6.6.Marland KitchenMarland Kitchen may need meds, get weight down. ~  labs 4/12 on diet alone showed BS= 132, A1c= 7.3.Marland KitchenMarland Kitchen rec to start MetforminER 500mg Qam. ~  Labs 9/12 on MetformER500/d showed BS=151, A1c=7.1.Marland KitchenMarland Kitchen Continue same...  OVERWEIGHT (ICD-278.02) - as above: we discussed diet, exercise, get weight down... ~  weight 7/09 = 247# ~  weight 6/10 = 241# ~  weight 6/11 = 240# ~  Weight 4/12 = 244# ~  Weight 9/12 = 239#  DIVERTICULOSIS OF COLON (ICD-562.10) & COLONIC POLYPS  (ICD-211.3) ~  colonoscopy 7/07 by DrStark showed 7mm polyp= tubular adenoma... ~  colonoscopy 11/10 showed divertics, polyp= tub adenoma, +hems... f/u planned 42yrs.  LIVER FUNCTION TESTS, ABNORMAL, HX OF (ICD-V12.2) - could be Statin related vs Steatosis... ~  labs 6/10 showed AlkPhos=71, SGOT=47, SGPT=65 ~  labs 6/11 showed AlkPhos=61, SGOT=39, SGPT=57 ~  Labs 4/12 off statin Rx showed SGOT=30, SGPT=50  BENIGN PROSTATIC HYPERTROPHY, HX OF (ICD-V13.8) - hx of elevated PSA w/ neg prostate bx's from [facility] in 2003 (PSA was 17)... ~  PSA 5/08 was 0.66... ~  PSA 7/09 is 0.52... ~  Eval 6/10 DrNesi: doing well, PSA reported 0.90 ~  labs 6/11 showed PSA= 0.59 ~  Labs 4/12 showed PSA= 0.68  DEGENERATIVE JOINT DISEASE (ICD-715.90) - he uses ETODOLAC as needed.  Health Maintenance>  ~  GI:  Followed byDrStark w/ hx adenomatous polyps on colon check 2007 & 2010> f/u due 11/15 ~  GU:  Followed by Thermon Leyland w/ hx elev PSA & neg bx 2003; yearly PSAs all <1 since then & PSA 4/12= 0.68 ~  Immuniz:  He had Pneumovax in 2000 at age 45, repeat due when he turns 55... He refuses the seasonal FLU vaccine.   Past Surgical History  Procedure Date  . Vasectomy     by Dr. Brunilda Payor    Outpatient Encounter Prescriptions as of 04/10/2011  Medication Sig Dispense Refill  . cetirizine (ZYRTEC) 10 MG tablet Take 10 mg by mouth daily as needed.       . etodolac (LODINE) 400 MG tablet Take 1 tablet (400 mg total) by mouth 2 (two) times daily. As needed for arthritis pain  180 tablet  3  . metFORMIN (GLUCOPHAGE XR) 500 MG 24 hr tablet Take 1 tablet (500 mg total) by mouth daily with breakfast.  90 tablet  3  . multivitamin (THERAGRAN) per tablet Take 1 tablet by mouth daily.        . sildenafil (VIAGRA) 100 MG tablet Take 100 mg by mouth daily as needed.        . valsartan-hydrochlorothiazide (DIOVAN-HCT) 160-25 MG per tablet Take 1 tablet by mouth daily.  90 tablet  3  . glucose blood test strip 1 each by Other route  as needed. Use as instructed       . glucose blood test strip Use as instructed  100 each  0  . Lancets (FREESTYLE) lancets Use as instructed  100 each  0  . rosuvastatin (CRESTOR) 20 MG tablet Take 1 tablet (20 mg total) by mouth at bedtime.  90  tablet  3    No Known Allergies   Current Medications, Allergies, Past Medical History, Past Surgical History, Family History, and Social History were reviewed in [employer] record.    Review of Systems       The patient complains of some arthritis.  The patient denies fever, chills, sweats, anorexia, fatigue, weakness, malaise, weight loss, sleep disorder, blurring, diplopia, eye irritation, eye discharge, vision loss, eye pain, photophobia, earache, ear discharge, tinnitus, decreased hearing, nasal congestion, nosebleeds, sore throat, hoarseness, chest pain, syncope, dyspnea on exertion, orthopnea, PND, peripheral edema, cough, dyspnea at rest, excessive sputum, hemoptysis, wheezing, pleurisy, nausea, vomiting, diarrhea, constipation, change in bowel habits, abdominal pain, melena, hematochezia, jaundice, gas/bloating, indigestion/heartburn, dysphagia, odynophagia, dysuria, hematuria, urinary frequency, urinary hesitancy, nocturia, incontinence, back pain, joint pain, joint swelling, muscle cramps, muscle weakness, stiffness, sciatica, restless legs, leg pain at night, leg pain with exertion, rash, itching, dryness, suspicious lesions, paralysis, paresthesias, seizures, tremors, vertigo, transient blindness, frequent falls, frequent headaches, difficulty walking, depression, anxiety, memory loss, confusion, cold intolerance, heat intolerance, polydipsia, polyphagia, polyuria, unusual weight change, abnormal bruising, bleeding, enlarged lymph nodes, urticaria, allergic rash, hay fever, and recurrent infections.   Objective:   Physical Exam     WD, Overweight, 60 y/o BM in NAD... Vital Signs:  Reviewed w/ pt... GENERAL:  Alert &  oriented; pleasant & cooperative... HEENT:  [place]/AT, EOM-wnl, PERRLA, EACs-clear, TMs-wnl, NOSE-clear, THROAT-clear & wnl. NECK:  Supple w/ full ROM; no JVD; normal carotid impulses w/o bruits; no thyromegaly or nodules palpated; no lymphadenopathy. CHEST:  Clear to P & A; without wheezes/ rales/ or rhonchi heard... HEART:  Regular Rhythm; without murmurs/ rubs/ or gallops detected... ABDOMEN:  Soft & nontender; normal bowel sounds; no organomegaly or masses palpated... RECTAL:  Sl boggy & tender prostate... EXT: without deformities or arthritic changes; no varicose veins/ venous insuffic/ or edema. NEURO:  CN's intact; motor testing normal; sensory testing normal; gait normal & balance OK DERM:  No lesions noted; no rash etc...   Assessment & Plan:   UTI, BPH, Hx elev PSA>  UTI symptoms and UA w/ +nitrite/ bact/ etc; likely PROSTATITIS & Cult + EColi, pan-sens & ok to treat w/ SeptraDS vs Cipro, etc;  We will also give a trial of FLOMAX 0.4mg /d...   OSA>  Stable, denies sleep disordered breathing other than snoring; wakes refreshed, no daytime hypersom etc...  HBP>  Controlled on Diovan/HCT;  Continue same meds, needs better diet, get wt down!  CHOL>  Lipids back up off the Lipitor (caused cramps in hands & feet, and sl incr LFTs);  He is willing to try Crestor 20mg /d...  DM>  FBS now 132 & A1c is 7.3;  He hasn't been able to effectively diet or exercise;  He needs to lose a substantiual amt of weight & we discussed 10-15 lb increments; in the meanwhile> start MetforminER 500mg  Qam...  Other medical problems as noted.Marland KitchenMarland Kitchen

## 2011-04-11 ENCOUNTER — Telehealth: Payer: Self-pay | Admitting: *Deleted

## 2011-04-11 MED ORDER — CIPROFLOXACIN HCL 500 MG PO TABS: 500.0000 mg | ORAL_TABLET | Freq: Two times a day (BID) | ORAL | Status: AC

## 2011-04-11 NOTE — Telephone Encounter (Signed)
pts wife called and gave me pts cell number .  Called and spoke with pt and he is aware per SN of UA results and that the  rx for abx has been sent to the pts pharmacy.

## 2011-04-11 NOTE — Telephone Encounter (Signed)
Per SN----UA with +UTI   Culture was positive for ecoli----recs for this is cipro 500mg    1 po bid until gone  #20.  lmomtcb for pt to make him aware.

## 2011-04-12 ENCOUNTER — Encounter: Payer: Self-pay | Admitting: Pulmonary Disease

## 2011-04-12 LAB — URINE CULTURE

## 2011-04-16 ENCOUNTER — Telehealth: Payer: Self-pay | Admitting: Pulmonary Disease

## 2011-04-16 NOTE — Telephone Encounter (Signed)
I spoke with pt and he is aware to continue to take his flomax every night at bedtime. Pt is requesting his labs be mailed to him. This has been done

## 2011-04-24 ENCOUNTER — Telehealth: Payer: Self-pay | Admitting: Pulmonary Disease

## 2011-04-24 MED ORDER — PITAVASTATIN CALCIUM 1 MG PO TABS: 1.0000 mg | ORAL_TABLET | Freq: Every day | ORAL | Status: DC

## 2011-04-24 NOTE — Telephone Encounter (Signed)
Per SN--try livalo 1mg   At bedtime---this is the lowest dose statin on the market.  Called and spoke with pt and he is aware that the med has been sent to the pharmacy per pts request.

## 2011-04-24 NOTE — Telephone Encounter (Signed)
Spoke with pt. He states that ever since started taking crestor has had horrible cramps in his fingers, legs, toes. Would like to stop and start different med. Please advise, thanks! No Known Allergies

## 2011-04-26 ENCOUNTER — Telehealth: Payer: Self-pay | Admitting: Pulmonary Disease

## 2011-04-26 MED ORDER — AZITHROMYCIN 250 MG PO TABS: ORAL_TABLET | ORAL | Status: AC

## 2011-04-26 NOTE — Telephone Encounter (Signed)
Spoke with pt and notified rx for zpack sent. He verbalized understanding.

## 2011-04-26 NOTE — Telephone Encounter (Signed)
Spoke with pt. He states has had PND, sinus pressure and sore throat x 2 days. He states "sinuses just messed". No cough, fever, or other complaints. Would like a zpack. Please advise thanks! No Known Allergies

## 2011-04-26 NOTE — Telephone Encounter (Signed)
Per SN---ok for zpak #1  Take as directed with no refills.  thanks

## 2011-05-09 ENCOUNTER — Ambulatory Visit: Payer: Federal, State, Local not specified - PPO | Admitting: Pulmonary Disease

## 2011-06-10 ENCOUNTER — Telehealth: Payer: Self-pay | Admitting: Pulmonary Disease

## 2011-06-10 MED ORDER — PITAVASTATIN CALCIUM 1 MG PO TABS: 1.0000 | ORAL_TABLET | Freq: Every day | ORAL | Status: DC

## 2011-06-10 NOTE — Telephone Encounter (Signed)
Spoke with pt's spouse. She states needs rx for livalo sent to [facility]- 90 day supply. Rx was sent to pharm.

## 2011-06-27 ENCOUNTER — Telehealth: Payer: Self-pay | Admitting: Pulmonary Disease

## 2011-06-27 MED ORDER — ETODOLAC 400 MG PO TABS: 400.0000 mg | ORAL_TABLET | Freq: Two times a day (BID) | ORAL | Status: DC

## 2011-06-27 NOTE — Telephone Encounter (Signed)
Called and spoke with pts wife and she is aware that this has been sent to the pharmacy

## 2011-08-12 ENCOUNTER — Telehealth: Payer: Self-pay | Admitting: Pulmonary Disease

## 2011-08-12 NOTE — Telephone Encounter (Signed)
Pls advise on labs for pt. CPX on 1/15 @ 10am.

## 2011-08-13 ENCOUNTER — Ambulatory Visit (INDEPENDENT_AMBULATORY_CARE_PROVIDER_SITE_OTHER): Payer: Federal, State, Local not specified - PPO | Admitting: Pulmonary Disease

## 2011-08-13 ENCOUNTER — Encounter: Payer: Self-pay | Admitting: Pulmonary Disease

## 2011-08-13 ENCOUNTER — Ambulatory Visit: Payer: Federal, State, Local not specified - PPO

## 2011-08-13 ENCOUNTER — Other Ambulatory Visit: Payer: Federal, State, Local not specified - PPO

## 2011-08-13 ENCOUNTER — Other Ambulatory Visit: Payer: Self-pay | Admitting: *Deleted

## 2011-08-13 ENCOUNTER — Other Ambulatory Visit: Payer: Self-pay | Admitting: Pulmonary Disease

## 2011-08-13 VITALS — BP 118/78 | HR 100 | Temp 97.6°F | Ht 71.0 in | Wt 240.0 lb

## 2011-08-13 DIAGNOSIS — I10 Essential (primary) hypertension: Secondary | ICD-10-CM

## 2011-08-13 DIAGNOSIS — E7800 Pure hypercholesterolemia, unspecified: Secondary | ICD-10-CM

## 2011-08-13 DIAGNOSIS — E119 Type 2 diabetes mellitus without complications: Secondary | ICD-10-CM

## 2011-08-13 DIAGNOSIS — G4733 Obstructive sleep apnea (adult) (pediatric): Secondary | ICD-10-CM

## 2011-08-13 DIAGNOSIS — Z862 Personal history of diseases of the blood and blood-forming organs and certain disorders involving the immune mechanism: Secondary | ICD-10-CM

## 2011-08-13 DIAGNOSIS — E663 Overweight: Secondary | ICD-10-CM

## 2011-08-13 DIAGNOSIS — Z87898 Personal history of other specified conditions: Secondary | ICD-10-CM

## 2011-08-13 DIAGNOSIS — K5730 Diverticulosis of large intestine without perforation or abscess without bleeding: Secondary | ICD-10-CM

## 2011-08-13 DIAGNOSIS — J069 Acute upper respiratory infection, unspecified: Secondary | ICD-10-CM

## 2011-08-13 DIAGNOSIS — Z8639 Personal history of other endocrine, nutritional and metabolic disease: Secondary | ICD-10-CM

## 2011-08-13 DIAGNOSIS — D126 Benign neoplasm of colon, unspecified: Secondary | ICD-10-CM

## 2011-08-13 DIAGNOSIS — M1990 Unspecified osteoarthritis, unspecified site: Secondary | ICD-10-CM

## 2011-08-13 LAB — HEPATIC FUNCTION PANEL
ALT: 63 U/L — ABNORMAL HIGH (ref 0–53)
AST: 38 U/L — ABNORMAL HIGH (ref 0–37)
Albumin: 4.3 g/dL (ref 3.5–5.2)
Alkaline Phosphatase: 60 U/L (ref 39–117)
Bilirubin, Direct: 0.2 mg/dL (ref 0.0–0.3)
Total Protein: 7.6 g/dL (ref 6.0–8.3)

## 2011-08-13 LAB — CBC WITH DIFFERENTIAL/PLATELET
Basophils Relative: 0.9 % (ref 0.0–3.0)
Eosinophils Relative: 0.4 % (ref 0.0–5.0)
Lymphocytes Relative: 39 % (ref 12.0–46.0)
MCV: 98.3 fl (ref 78.0–100.0)
Monocytes Absolute: 0.7 10*3/uL (ref 0.1–1.0)
Neutrophils Relative %: 37.2 % — ABNORMAL LOW (ref 43.0–77.0)
Platelets: 151 10*3/uL (ref 150.0–400.0)
RBC: 4.55 Mil/uL (ref 4.22–5.81)
WBC: 3.3 10*3/uL — ABNORMAL LOW (ref 4.5–10.5)

## 2011-08-13 LAB — URINALYSIS, ROUTINE W REFLEX MICROSCOPIC
Hgb urine dipstick: NEGATIVE
Leukocytes, UA: NEGATIVE
Nitrite: NEGATIVE
pH: 6 (ref 5.0–8.0)

## 2011-08-13 LAB — BASIC METABOLIC PANEL
BUN: 25 mg/dL — ABNORMAL HIGH (ref 6–23)
Calcium: 9.9 mg/dL (ref 8.4–10.5)
Chloride: 99 mEq/L (ref 96–112)
Creatinine, Ser: 1.3 mg/dL (ref 0.4–1.5)
GFR: 71.2 mL/min (ref >60.00)

## 2011-08-13 LAB — PSA: PSA: 1.55 ng/mL (ref 0.10–4.00)

## 2011-08-13 MED ORDER — AZITHROMYCIN 250 MG PO TABS: ORAL_TABLET | ORAL | Status: AC

## 2011-08-13 MED ORDER — TAMSULOSIN HCL 0.4 MG PO CAPS: 0.4000 mg | ORAL_CAPSULE | Freq: Every day | ORAL | Status: DC

## 2011-08-13 NOTE — Progress Notes (Signed)
Subjective:    Patient ID: Levi Lee, male    DOB: 1950/09/06, 61 y.o.   MRN: [id number]  HPI 61 y/o BM here for a folllow up visit... He has mult medical problems as noted below>   ~  January 10, 2010:  Yearly ROV doing satis & his CC is paresthesias in toes> could be early neuropathy w/ his mild gluc intol & offered Lyrica trial but it's not that bad & he wants to watch it... he notes rare episodes of self-limited palpit> rapid regular & review of hx shows episode of PAT yrs ago Rx in ER w/ "shot" converted to NSR... we discussed no caffeine etc & he will let us know if symptoms worsen...  ~  November 06, 2010:  66mo ROV and he reports doing satis w/o new complaints or concerns> he stopped his Lipitor80 about 3 months ago he says due to cramping in his hands & feet (resolved off the med), he is willing to consider a diff med if necessary based on  today's fasting blood work (Chol=267 & rec to try Cres20);  BP controlled on med but unfortunately he has not lost any wt> we reviewed diet & exercise prescription...  FBS remains elevated & A1c=7.3 therefore start MetforminER 500mg Qam; he really needs to do better & he understands the risks...  ~  April 10, 2011:  41mo ROV & add-on for urinary symptoms> c/o 3-4d hx slow stream, hesitant w/ freq 4-5 days, 2-3 nights but he denies incont, dysuria, fever, etc;  He has hx BPH & has seen DrNesi in past w/ elev PSA & neg bx in 2003; We discussed checking UA and Cult (+EColi- pan sens) and treating w/ empiric SeptraDS until cult avail...    >HBP controlled on DiovanHCT w/ BP= 110/70 today & he denies CP, palpit, SOB, edema, etc...    >Chol was at goal on Lip80 but he stopped 1/12 due to muscles cramps which resolved off the Rx; f/u FLP 4/12 looked terrible & he was placed on Cres20; f/u FLP on Cres20 is pending...    >DM treated w/ diet, exercise, & MetforminER 500mg /d started 4/12> he notes home BS ave ~130 & labs today showed BS=151, A1c=7.1 improved from 7.3;  continue same med, get on diet, get wt down...  ~  August 13, 2011:  56mo ROV & Levi Lee is c/o sinusitis w/ yellow drainage & wants a ZPak, he says;  BP controlled on meds & he denies CP, palpit, dizzy, SOB, or edema;  See problem list below>>    >Wife is c/o his snoring & sleep apnea which she feels is getting worse; now he endorses some daytime hypersomnolence & sleep pressure; I told him to avoid driving any distances & sched Sleep Study ASAP; we reviewed diet & exercise program- wt reduction is paramount & he understands...    >He was also intol to the Crestor20 stating that it caused cramping all over & he stopped it after several months (never even got f/u FLP on the med); we called in the ultra-low-dose Livalo 1mg  & he has been tolerating this; FLP today showed TChol 222, TG 106, HDL 39, LDL 156...    >His wt is up 2# further to 241# & not really on diet/ exercising/ etc; FBS at home averages 120-130 he says; Labs today> FBS= 171, A1c= 7.4 and we will recommend increasing his METFORMIN to 500mg  Bid...    >He also notes that his energy is low, ?lost his drive etc; he is  on Flomax0.4 & voiding prev symptoms are improved; we discussed checking for "Low-T" (Testos level = 350 (norm 350-900)...    >He tells me he fell w/ rotator cuff injury per DrWainer (we don't have notes from him); no surg yet, he had phys therapy & improved...         Problem List:  Hx of OBSTRUCTIVE SLEEP APNEA (ICD-327.23) - eval in 1996 w/ sleep study showing RDI= 19, mod snoring, O2 sat to 90%... saw DrRedman for ENT due to snoring & tonsillar hypertrophy- offered UPPP surg but pt declined... since then he's gained weight, continued snoring w/o daytime hypersom etc... not interested in further evaluation. ~  4/12:  Pt states that wife does c/o his snoring but he states he rests well, wakes refreshed, no daytime symptoms...  HYPERTENSION (ICD-401.9) - on DIOVAN/Hct 160-25 daily... ~  baseline EKG showed NSR, WNL.[name]. ~   baseline CXR showed clear, NAD; DJD spine.  HYPERCHOLESTEROLEMIA (ICD-272.0) - prev on Lip80, pt stopped on his own 1/12 due to cramping (resolved off the med); NOTE: he had mild incr LFTs as well while on the Lip80 & these ret to normal off the med; He notes similar cramping all over from Crestor20; Now on LIVALO 1mg /d & tol ok so far... ~  FLP 7/09 on Lip80 showed TChol 165, TG 165, HDL 37, LDL 95... same med, better diet, lose wt. ~  FLP 6/10 on Lip80 showed TChol 148, TG 74, HDL 44, LDL 90 ~  FLP 6/11 on Lip80 showed TChol 180, TG 148, HDL 43, LDL 108... needs to get wt down! ~  FLP 4/12 on diet alone showed TChol 257, TG 202, HDL41, LDL176... rec to start Crestor 20mg /d. ~  9/12 OV ==> not fasting for FLP today & he stopped Cres20 after 7mo due to cramping all over; rec trial LIVALO 1mg . ~  FLP 1/13 on Livalo1mg  showed TChol 222, TG 106, HDL 39, LDL 156... Improved but not at goal, continue same med & get wt down!  DIABETES MELLITUS >> hx elev FBS in past> diet Rx; started on METFORMIN  ER 500mg Qam 4/12 w/ A1c up to 7.3 ~  labs 6/10 showed BS= 111, A1c= 6.4 ~  labs 6/11 showed BS= 148, A1c= 6.6.Marland KitchenMarland Kitchen may need meds, get weight down. ~  labs 4/12 on diet alone showed BS= 132, A1c= 7.3.Marland KitchenMarland Kitchen rec to start MetforminER 500mg Qam. ~  Labs 9/12 on MetformER500/d showed BS=151, A1c=7.1.Marland KitchenMarland Kitchen Continue same... ~  Labs 1/13 on MetformER500/d showed BS=171, A1c=7.4.Marland KitchenMarland Kitchen rec to incr Metformin to 500mg  Bid.  OVERWEIGHT (ICD-278.02) - as above: we discussed diet, exercise, get weight down... ~  weight 7/09 = 247# ~  weight 6/10 = 241# ~  weight 6/11 = 240# ~  Weight 4/12 = 244# ~  Weight 9/12 = 239# ~  Weight 1/13 = 241#  DIVERTICULOSIS OF COLON (ICD-562.10) & COLONIC POLYPS (ICD-211.3) ~  colonoscopy 7/07 by DrStark showed 7mm polyp= tubular adenoma... ~  colonoscopy 11/10 showed divertics, polyp= tub adenoma, +hems... f/u planned 73yrs.  LIVER FUNCTION TESTS, ABNORMAL, HX OF (ICD-V12.2) - could be Statin  related vs Steatosis... ~  labs 6/10 showed AlkPhos=71, SGOT=47, SGPT=65 ~  labs 6/11 showed AlkPhos=61, SGOT=39, SGPT=57 ~  Labs 4/12 off statin Rx showed SGOT=30, SGPT=50 ~  Labs 1/13 on Livalo1 showed SGOT=38, SGPT=63  BENIGN PROSTATIC HYPERTROPHY, HX OF (ICD-V13.8) - hx of elevated PSA w/ neg prostate bx's from [facility] in 2003 (PSA was 17)... ~  PSA 5/08 was 0.66... ~  PSA 7/09 is 0.52... ~  Eval 6/10 DrNesi: doing well, PSA reported 0.90 ~  labs 6/11 showed PSA= 0.59 ~  Labs 4/12 showed PSA= 0.68 ~  Labs 1/13 showed PSA= 1.55 & Testos level = 350...  DEGENERATIVE JOINT DISEASE (ICD-715.90) - he uses ETODOLAC as needed.  Health Maintenance>  ~  GI:  Followed byDrStark w/ hx adenomatous polyps on colon check 2007 & 2010> f/u due 11/15 ~  GU:  Followed by Thermon Leyland w/ hx elev PSA & neg bx 2003; yearly PSAs all <1 since then & PSA 4/12= 0.68 ~  Immuniz:  He had Pneumovax in 2000 at age 66, repeat due when he turns 33... He refuses the seasonal FLU vaccine.   Past Surgical History  Procedure Date  . Vasectomy     by Dr. Brunilda Payor    Outpatient Encounter Prescriptions as of 08/13/2011  Medication Sig Dispense Refill  . cetirizine (ZYRTEC) 10 MG tablet Take 10 mg by mouth daily as needed.       . etodolac (LODINE) 400 MG tablet Take 1 tablet (400 mg total) by mouth 2 (two) times daily. As needed for arthritis pain  180 tablet  3  . glucose blood test strip 1 each by Other route as needed. Use as instructed       . glucose blood test strip Use as instructed  100 each  0  . Lancets (FREESTYLE) lancets Use as instructed  100 each  0  . metFORMIN (GLUCOPHAGE XR) 500 MG 24 hr tablet Take 1 tablet (500 mg total) by mouth daily with breakfast.  90 tablet  3  . multivitamin (THERAGRAN) per tablet Take 1 tablet by mouth daily.        . Pitavastatin Calcium 1 MG TABS Take 1 tablet (1 mg total) by mouth at bedtime.  90 tablet  3  . sildenafil (VIAGRA) 100 MG tablet Take 100 mg by mouth daily as  needed.        . Tamsulosin HCl (FLOMAX) 0.4 MG CAPS Take 1 capsule (0.4 mg total) by mouth at bedtime.  30 capsule  5  . valsartan-hydrochlorothiazide (DIOVAN-HCT) 160-25 MG per tablet Take 1 tablet by mouth daily.  90 tablet  3    No Known Allergies   Current Medications, Allergies, Past Medical History, Past Surgical History, Family History, and Social History were reviewed in [employer] record.    Review of Systems       The patient complains of some arthritis.  The patient denies fever, chills, sweats, anorexia, fatigue, weakness, malaise, weight loss, sleep disorder, blurring, diplopia, eye irritation, eye discharge, vision loss, eye pain, photophobia, earache, ear discharge, tinnitus, decreased hearing, nasal congestion, nosebleeds, sore throat, hoarseness, chest pain, syncope, dyspnea on exertion, orthopnea, PND, peripheral edema, cough, dyspnea at rest, excessive sputum, hemoptysis, wheezing, pleurisy, nausea, vomiting, diarrhea, constipation, change in bowel habits, abdominal pain, melena, hematochezia, jaundice, gas/bloating, indigestion/heartburn, dysphagia, odynophagia, dysuria, hematuria, urinary frequency, urinary hesitancy, nocturia, incontinence, back pain, joint pain, joint swelling, muscle cramps, muscle weakness, stiffness, sciatica, restless legs, leg pain at night, leg pain with exertion, rash, itching, dryness, suspicious lesions, paralysis, paresthesias, seizures, tremors, vertigo, transient blindness, frequent falls, frequent headaches, difficulty walking, depression, anxiety, memory loss, confusion, cold intolerance, heat intolerance, polydipsia, polyphagia, polyuria, unusual weight change, abnormal bruising, bleeding, enlarged lymph nodes, urticaria, allergic rash, hay fever, and recurrent infections.   Objective:   Physical Exam     WD, Overweight, 60 y/o BM in NAD... Vital  Signs:  Reviewed w/ pt... GENERAL:  Alert & oriented; pleasant &  cooperative... HEENT:  [place]/AT, EOM-wnl, PERRLA, EACs- +cerumen, TMs-wnl, NOSE-clear, THROAT-clear & wnl. NECK:  Supple w/ full ROM; no JVD; normal carotid impulses w/o bruits; no thyromegaly or nodules palpated; no lymphadenopathy. CHEST:  Clear to P & A; without wheezes/ rales/ or rhonchi heard... HEART:  Regular Rhythm; without murmurs/ rubs/ or gallops detected... ABDOMEN:  Soft & nontender; normal bowel sounds; no organomegaly or masses palpated... RECTAL:  Sl boggy & tender prostate... EXT: without deformities or arthritic changes; no varicose veins/ venous insuffic/ or edema. NEURO:  CN's intact; motor testing normal; sensory testing normal; gait normal & balance OK DERM:  No lesions noted; no rash etc...  RADIOLOGY DATA:  Reviewed in the EPIC EMR & discussed w/ the patient...  LABORATORY DATA:  Reviewed in the EPIC EMR & discussed w/ the patient...   Assessment & Plan:   OSA>  Now he admits to hypersomnolence & wife c/o snoring etc;  He needs Sleep Study & then CPAP trial; in the interim he desperately needs to lose weight...  HBP>  Controlled on Diovan/HCT;  Continue same meds, needs better diet, get wt down!  CHOL>  Lipids are improved but not at goals on Livalo 1mg Qhs; rec same med, better diet & must get wt down or incr med next time...  DM>  FBS now 171 & A1c 7.4 (on MetformER500Qam);  He hasn't been able to effectively diet or exercise;  He needs to lose a substantiual amt of weight & we discussed 10-15 lb increments; in the meanwhile> increase Metformin to 500mg  Bid...  UTI, BPH, Hx elev PSA>  Prev UTI symptoms & PROSTATITIS (EColi, pan-sens & ok to treat w/ SeptraDS vs Cipro) resolved & current UA C&S is neg==> resolved;  We will also give a trial of FLOMAX 0.4mg /d...  Other medical problems as noted...   Patient's Medications  New Prescriptions   AZITHROMYCIN (ZITHROMAX) 250 MG TABLET    Take 2 tablets (500 mg) on  Day 1,  followed by 1 tablet (250 mg) once daily on  Days 2 through 5.  Previous Medications   CETIRIZINE (ZYRTEC) 10 MG TABLET    Take 10 mg by mouth daily as needed.    ETODOLAC (LODINE) 400 MG TABLET    Take 1 tablet (400 mg total) by mouth 2 (two) times daily. As needed for arthritis pain   GLUCOSE BLOOD TEST STRIP    1 each by Other route as needed. Use as instructed    GLUCOSE BLOOD TEST STRIP    Use as instructed   LANCETS (FREESTYLE) LANCETS    Use as instructed   METFORMIN (GLUCOPHAGE XR) 500 MG 24 HR TABLET    Take 1 tablet (500 mg total) by mouth daily with breakfast.   MULTIVITAMIN (THERAGRAN) PER TABLET    Take 1 tablet by mouth daily.     PITAVASTATIN CALCIUM 1 MG TABS    Take 1 tablet (1 mg total) by mouth at bedtime.   SILDENAFIL (VIAGRA) 100 MG TABLET    Take 100 mg by mouth daily as needed.    Modified Medications   Modified Medication Previous Medication   TAMSULOSIN HCL (FLOMAX) 0.4 MG CAPS Tamsulosin HCl (FLOMAX) 0.4 MG CAPS      Take 1 capsule (0.4 mg total) by mouth at bedtime.    Take 1 capsule (0.4 mg total) by mouth at bedtime.   VALSARTAN-HYDROCHLOROTHIAZIDE (DIOVAN-HCT) 160-25 MG PER TABLET valsartan-hydrochlorothiazide (DIOVAN-HCT)  160-25 MG per tablet      Take 1 tablet by mouth daily.    Take 1 tablet by mouth daily.  Discontinued Medications   SULFAMETHOXAZOLE-TRIMETHOPRIM (BACTRIM DS) 800-160 MG PER TABLET    Take one tab by mouth twice daily until gone

## 2011-08-13 NOTE — Patient Instructions (Signed)
Today we updated your med list in our EPIC system...    Continue your current medications the same...    We wrote a new prescription for a ZPak & gave you a refill in case you need it later...  Today we did your follow up fasting blood work...    Please call the PHONE TREE in a few days for your results...    Dial [id number] & when prompted enter your patient number followed by the # symbol...    Your patient number is:  [id number]#  We will get you a new Diabetic Meter as requested...  We will arrange for a much needed Sleep study & we will call you w/ the results when avail...  Let's get on track w/ our diet 7 exercise program...    The goal is to lose 15-20 lbs...  Call for any questions...  Let's plan a follow up visit in  6months.Marland KitchenMarland Kitchen

## 2011-08-13 NOTE — Telephone Encounter (Signed)
Labs are in the computer for the pt.

## 2011-08-14 LAB — LIPID PANEL
Cholesterol: 222 mg/dL — ABNORMAL HIGH (ref 0–200)
Total CHOL/HDL Ratio: 6
Triglycerides: 106 mg/dL (ref 0.0–149.0)

## 2011-08-15 ENCOUNTER — Telehealth: Payer: Self-pay | Admitting: Pulmonary Disease

## 2011-08-15 LAB — TESTOSTERONE: Testosterone: 349.56 ng/dL — ABNORMAL LOW (ref 350.00–890.00)

## 2011-08-15 MED ORDER — VALSARTAN-HYDROCHLOROTHIAZIDE 160-25 MG PO TABS: 1.0000 | ORAL_TABLET | Freq: Every day | ORAL | Status: DC

## 2011-08-15 NOTE — Telephone Encounter (Signed)
Spoke with Levi Lee to verify the msg Rx sent for 90 day supply per her request Nothing further needed

## 2011-08-18 ENCOUNTER — Encounter: Payer: Self-pay | Admitting: Pulmonary Disease

## 2011-08-19 ENCOUNTER — Other Ambulatory Visit: Payer: Self-pay | Admitting: *Deleted

## 2011-08-19 MED ORDER — METFORMIN HCL 500 MG PO TABS: 500.0000 mg | ORAL_TABLET | Freq: Two times a day (BID) | ORAL | Status: DC

## 2011-08-19 MED ORDER — VALSARTAN-HYDROCHLOROTHIAZIDE 160-25 MG PO TABS: 1.0000 | ORAL_TABLET | Freq: Every day | ORAL | Status: DC

## 2011-08-27 ENCOUNTER — Ambulatory Visit (HOSPITAL_BASED_OUTPATIENT_CLINIC_OR_DEPARTMENT_OTHER): Payer: Federal, State, Local not specified - PPO | Attending: Pulmonary Disease | Admitting: General Practice

## 2011-08-27 VITALS — Ht 71.0 in | Wt 240.0 lb

## 2011-08-27 DIAGNOSIS — G4733 Obstructive sleep apnea (adult) (pediatric): Secondary | ICD-10-CM | POA: Insufficient documentation

## 2011-08-27 DIAGNOSIS — G4737 Central sleep apnea in conditions classified elsewhere: Secondary | ICD-10-CM | POA: Insufficient documentation

## 2011-08-30 ENCOUNTER — Telehealth: Payer: Self-pay | Admitting: Pulmonary Disease

## 2011-08-30 MED ORDER — CLOTRIMAZOLE-BETAMETHASONE 1-0.05 % EX CREA: TOPICAL_CREAM | CUTANEOUS | Status: DC

## 2011-08-30 NOTE — Telephone Encounter (Signed)
I spoke with pt and he is requesting an rx for clortrimazole to have on hand. He states he uses this for when he gets bumps/rash. Pt states he sweats a lot and sometimes it causes him to get a rash. Please advise Dr. Kriste Basque, thanks  [facility] [address]

## 2011-08-30 NOTE — Telephone Encounter (Signed)
Per SN---generic lostrisone cream  #1 tube  Apply as directed with 1 refill.  thanks

## 2011-08-30 NOTE — Telephone Encounter (Signed)
Called, spoke with pt.  He is aware of SN's recs and advised rx sent to [facility].  He verbalized understanding and voiced no further questions/concerns at this time.

## 2011-09-03 DIAGNOSIS — G4733 Obstructive sleep apnea (adult) (pediatric): Secondary | ICD-10-CM

## 2011-09-03 DIAGNOSIS — G4737 Central sleep apnea in conditions classified elsewhere: Secondary | ICD-10-CM

## 2011-09-06 NOTE — Procedures (Signed)
NAME:  Levi Lee, Levi Lee NO.:  [id number]  MEDICAL RECORD NO.:  [id number]          PATIENT TYPE:  OUT  LOCATION:  SLEEP CENTER                 FACILITY:  [facility]  PHYSICIAN:  Barbaraann Share, MD,FCCPDATE OF BIRTH:  1950/11/02  DATE OF STUDY:  08/27/2011                           NOCTURNAL POLYSOMNOGRAM  REFERRING PHYSICIAN:  Lonzo Cloud. Kriste Basque, MD  REFERRING PHYSICIAN:  Lonzo Cloud. Kriste Basque, MD  INDICATION FOR STUDY:  Hypersomnia with sleep apnea.  EPWORTH SCORE:  19.  SLEEP ARCHITECTURE:  The patient had a total sleep time of 350 minutes with no slow-wave sleep and decreased quantity of REM.  Sleep onset latency was normal at 7.5 minutes and REM onset was significantly delayed.  Sleep efficiency was mildly reduced at 88% during the diagnostic portion of the study and 80% during the titration portion of the study.  RESPIRATORY DATA:  The patient was scheduled for a standard NPSG and was found to have 166 obstructive events in the first 146 minutes of sleep. This gave him an apnea/hypopnea index of 69 events per hour during the diagnostic portion of the study.  Because of severe O2 desaturations, he met the emergency split night protocol, and the patient was fitted with a medium ResMed Mirage Quattro full face mask.  CPAP titration was initiated, however, at a pressure above CPAP of 17 cm.  The patient had significant pressure-induced central apneas.  He was tried on bilevel, but was unable to obtain adequate control of his central apneas.  It appeared that a CPAP pressure of 17 cm adequately controlled his apneic events without overly inducing central apneas.  The events occurred in all body positions and there was severe snoring noted throughout.  OXYGEN DATA:  There was O2 desaturation as low as 72% with the patient's obstructive events.  CARDIAC DATA:  No clinically significant arrhythmias were noted.  MOVEMENT/PARASOMNIA:  The patient had no significant leg jerks  or other abnormal behavior seen.  IMPRESSION/RECOMMENDATION:  Split night study reveals severe obstructive/central apneas with an AHI of 69 events per hour and O2 desaturation as low as 72%.  The patient was then fitted with a medium ResMed Mirage Quattro full face mask and found to have the best control of his events with a CPAP pressure of 17 cm.  He was tried on bilevel for a short period of time, but this resulted in significant pressure-induced central apneas.  The patient should also be encouraged to work aggressively on weight loss.     Barbaraann Share, MD,FCCP Diplomate, American Board of Sleep Medicine Electronically Signed    [facility]  D:  09/05/2011 07:59:00  T:  09/06/2011 00:32:30  Job:  [id number]

## 2011-09-24 ENCOUNTER — Ambulatory Visit (INDEPENDENT_AMBULATORY_CARE_PROVIDER_SITE_OTHER): Payer: Federal, State, Local not specified - PPO | Admitting: Pulmonary Disease

## 2011-09-24 ENCOUNTER — Encounter: Payer: Self-pay | Admitting: Pulmonary Disease

## 2011-09-24 VITALS — BP 124/76 | HR 86 | Temp 98.0°F | Ht 71.0 in | Wt 238.0 lb

## 2011-09-24 DIAGNOSIS — G4733 Obstructive sleep apnea (adult) (pediatric): Secondary | ICD-10-CM

## 2011-09-24 NOTE — Assessment & Plan Note (Signed)
The patient has severe obstructive sleep apnea by his recent sleep study, and is clearly symptomatic during the day.  I've also discussed with him the negative impact on his cardiovascular health.  At this point, I would like to start him on CPAP at a moderate pressure level, and I've encouraged him to work aggressively on weight loss. I will set the patient up on cpap at a moderate pressure level to allow for desensitization, and will troubleshoot the device over the next 4-6weeks if needed.  The pt is to call me if having issues with tolerance.  Will then optimize the pressure once patient is able to wear cpap on a consistent basis.

## 2011-09-24 NOTE — Patient Instructions (Signed)
Will start on cpap at a moderate level.  Please call if problems with tolerance or if you feel you need more pressure Work on weight loss followup with me in 6 weeks.

## 2011-09-24 NOTE — Progress Notes (Signed)
  Subjective:    Patient ID: Levi Lee, male    DOB: 07-31-1950, 61 y.o.   MRN: [id number]  HPI The patient is a 61 year old male who I been asked to see for management of obstructive sleep apnea.  He recently underwent  NPSG which showed an AHI of 69 events per hour, and he was subsequently titrated on CPAP and found to have an optimal pressure of around 17 cm.  The patient has been noted to have loud snoring by his wife, as well as a very abnormal breathing pattern during sleep.  The patient has very restless sleep with frequent awakenings, but feels overall that he is rested in the mornings upon arising.  However, he notes definite sleep pressure during the day with periods of inactivity, and can easily fall asleep if he sits still.  He also falls asleep easily in the evenings watching television.  He notes mild sleep pressure while driving.  The patient states that his weight is neutral over the last 2 years, and his Epworth sleepiness score today is 18.  Sleep Questionnaire: What time do you typically go to bed?( Between what hours) 10pm or 11pm How long does it take you to fall asleep? 3 minutes How many times during the night do you wake up? 5 What time do you get out of bed to start your day? 0530 Do you drive or operate heavy machinery in your occupation? No How much has your weight changed (up or down) over the past two years? (In pounds) 0 oz (0 kg) Have you ever had a sleep study before? Yes If yes, location of study? [facility] If yes, date of study? 08/27/11 Do you currently use CPAP? No Do you wear oxygen at any time? No     Review of Systems  Constitutional: Negative.  Negative for fever and unexpected weight change.  HENT: Negative.  Negative for ear pain, nosebleeds, congestion, sore throat, rhinorrhea, sneezing, trouble swallowing, dental problem, postnasal drip and sinus pressure.   Eyes: Negative.  Negative for redness and itching.  Respiratory: Negative.  Negative for cough, chest  tightness, shortness of breath and wheezing.   Cardiovascular: Negative.  Negative for palpitations and leg swelling.  Gastrointestinal: Negative.  Negative for nausea and vomiting.  Genitourinary: Negative.  Negative for dysuria.  Musculoskeletal: Negative.  Negative for joint swelling.  Skin: Negative.  Negative for rash.  Neurological: Negative.  Negative for headaches.  Hematological: Negative.  Does not bruise/bleed easily.  Psychiatric/Behavioral: Negative.  Negative for dysphoric mood. The patient is not nervous/anxious.        Objective:   Physical Exam Constitutional:  Overweight male, no acute distress  HENT:  Nares patent without discharge, but +turbinate hypertrophy  Oropharynx without exudate, palate and uvula are thick and elongated.  Eyes:  Perrla, eomi, no scleral icterus  Neck:  No JVD, no TMG  Cardiovascular:  Normal rate, regular rhythm, no rubs or gallops.  No murmurs        Intact distal pulses  Pulmonary :  Normal breath sounds, no stridor or respiratory distress   No rales, rhonchi, or wheezing  Abdominal:  Soft, nondistended, bowel sounds present.  No tenderness noted.   Musculoskeletal:  No lower extremity edema noted.  Lymph Nodes:  No cervical lymphadenopathy noted  Skin:  No cyanosis noted  Neurologic:  Appears sleepy, appropriate, moves all 4 extremities without obvious deficit.         Assessment & Plan:

## 2011-11-06 ENCOUNTER — Ambulatory Visit: Payer: Federal, State, Local not specified - PPO | Admitting: Pulmonary Disease

## 2011-11-07 ENCOUNTER — Telehealth: Payer: Self-pay

## 2011-11-07 DIAGNOSIS — Z862 Personal history of diseases of the blood and blood-forming organs and certain disorders involving the immune mechanism: Secondary | ICD-10-CM

## 2011-11-07 DIAGNOSIS — I10 Essential (primary) hypertension: Secondary | ICD-10-CM

## 2011-11-07 DIAGNOSIS — E7800 Pure hypercholesterolemia, unspecified: Secondary | ICD-10-CM

## 2011-11-07 NOTE — Telephone Encounter (Signed)
Lab orders placed. Spoke with pt's spouse and notified that this was done. She verbalized understanding and states nothing further needed.

## 2011-11-07 NOTE — Telephone Encounter (Signed)
Per SN---ok for pt to have labs done prior to OV--testosterone level, lip, hepat,bmp, a1c.  Will placed these in for the pt and will call and let him know.

## 2011-11-07 NOTE — Telephone Encounter (Signed)
SN please advise if pt will need repeat labs at this time.  thanks

## 2011-11-12 ENCOUNTER — Encounter: Payer: Self-pay | Admitting: Pulmonary Disease

## 2011-11-12 ENCOUNTER — Ambulatory Visit (INDEPENDENT_AMBULATORY_CARE_PROVIDER_SITE_OTHER): Payer: Federal, State, Local not specified - PPO | Admitting: Pulmonary Disease

## 2011-11-12 ENCOUNTER — Other Ambulatory Visit (INDEPENDENT_AMBULATORY_CARE_PROVIDER_SITE_OTHER): Payer: Federal, State, Local not specified - PPO

## 2011-11-12 ENCOUNTER — Ambulatory Visit (INDEPENDENT_AMBULATORY_CARE_PROVIDER_SITE_OTHER)
Admission: RE | Admit: 2011-11-12 | Discharge: 2011-11-12 | Disposition: A | Discharge status: Home / Self Care | Payer: Federal, State, Local not specified - PPO | Source: Ambulatory Visit | Attending: Pulmonary Disease | Admitting: Pulmonary Disease

## 2011-11-12 VITALS — BP 138/80 | HR 68 | Temp 97.0°F | Ht 71.0 in | Wt 236.2 lb

## 2011-11-12 DIAGNOSIS — E119 Type 2 diabetes mellitus without complications: Secondary | ICD-10-CM

## 2011-11-12 DIAGNOSIS — E291 Testicular hypofunction: Secondary | ICD-10-CM

## 2011-11-12 DIAGNOSIS — Z8639 Personal history of other endocrine, nutritional and metabolic disease: Secondary | ICD-10-CM

## 2011-11-12 DIAGNOSIS — E7800 Pure hypercholesterolemia, unspecified: Secondary | ICD-10-CM

## 2011-11-12 DIAGNOSIS — I10 Essential (primary) hypertension: Secondary | ICD-10-CM

## 2011-11-12 DIAGNOSIS — Z0000 Encounter for general adult medical examination without abnormal findings: Secondary | ICD-10-CM

## 2011-11-12 DIAGNOSIS — Z862 Personal history of diseases of the blood and blood-forming organs and certain disorders involving the immune mechanism: Secondary | ICD-10-CM

## 2011-11-12 DIAGNOSIS — Z87898 Personal history of other specified conditions: Secondary | ICD-10-CM

## 2011-11-12 DIAGNOSIS — G4733 Obstructive sleep apnea (adult) (pediatric): Secondary | ICD-10-CM

## 2011-11-12 DIAGNOSIS — M1990 Unspecified osteoarthritis, unspecified site: Secondary | ICD-10-CM

## 2011-11-12 DIAGNOSIS — E663 Overweight: Secondary | ICD-10-CM

## 2011-11-12 LAB — BASIC METABOLIC PANEL
BUN: 24 mg/dL — ABNORMAL HIGH (ref 6–23)
CO2: 28 mEq/L (ref 19–32)
Chloride: 101 mEq/L (ref 96–112)
Creatinine, Ser: 1.2 mg/dL (ref 0.4–1.5)
Glucose, Bld: 144 mg/dL — ABNORMAL HIGH (ref 70–99)

## 2011-11-12 LAB — LIPID PANEL
Cholesterol: 191 mg/dL (ref 0–200)
Triglycerides: 114 mg/dL (ref 0.0–149.0)

## 2011-11-12 LAB — HEPATIC FUNCTION PANEL
ALT: 35 U/L (ref 0–53)
Total Protein: 7.5 g/dL (ref 6.0–8.3)

## 2011-11-12 LAB — HEMOGLOBIN A1C: Hgb A1c MFr Bld: 6.7 % — ABNORMAL HIGH (ref 4.6–6.5)

## 2011-11-12 MED ORDER — PITAVASTATIN CALCIUM 1 MG PO TABS: 1.0000 | ORAL_TABLET | Freq: Every day | ORAL | Status: DC

## 2011-11-12 MED ORDER — VALSARTAN-HYDROCHLOROTHIAZIDE 160-25 MG PO TABS: 1.0000 | ORAL_TABLET | Freq: Every day | ORAL | Status: DC

## 2011-11-12 MED ORDER — TAMSULOSIN HCL 0.4 MG PO CAPS: 0.4000 mg | ORAL_CAPSULE | Freq: Every day | ORAL | Status: DC

## 2011-11-12 MED ORDER — ETODOLAC 400 MG PO TABS: 400.0000 mg | ORAL_TABLET | Freq: Two times a day (BID) | ORAL | Status: DC

## 2011-11-12 NOTE — Patient Instructions (Signed)
Today we updated your med list in our EPIC system...    Continue your current medications the same...  Today we did your follow up blood work...    We will call you w the results & mailyou a copy for your records...  Keep up the good work w/ diet & exercise...    The goal is to lose 10-15 lbs...  Call for any questions...  Let's plan a follow up visit in 6 months.Marland KitchenMarland Kitchen

## 2011-11-12 NOTE — Progress Notes (Signed)
Subjective:    Patient ID: Levi Lee, male    DOB: 1951-02-05, 61 y.o.   MRN: [id number]  HPI 60 y/o BM here for a folllow up visit... He has mult medical problems as noted below>   ~  January 10, 2010:  Yearly ROV doing satis & his CC is paresthesias in toes> could be early neuropathy w/ his mild gluc intol & offered Lyrica trial but it's not that bad & he wants to watch it... he notes rare episodes of self-limited palpit> rapid regular & review of hx shows episode of PAT yrs ago Rx in ER w/ "shot" converted to NSR... we discussed no caffeine etc & he will let us know if symptoms worsen...  ~  November 06, 2010:  788mo ROV and he reports doing satis w/o new complaints or concerns> he stopped his Lipitor80 about 3 months ago he says due to cramping in his hands & feet (resolved off the med), he is willing to consider a diff med if necessary based on  today's fasting blood work (Chol=267 & rec to try Cres20);  BP controlled on med but unfortunately he has not lost any wt> we reviewed diet & exercise prescription...  FBS remains elevated & A1c=7.3 therefore start MetforminER 500mg Qam; he really needs to do better & he understands the risks...  ~  April 10, 2011:  45mo ROV & add-on for urinary symptoms> c/o 3-4d hx slow stream, hesitant w/ freq 4-5 days, 2-3 nights but he denies incont, dysuria, fever, etc;  He has hx BPH & has seen DrNesi in past w/ elev PSA & neg bx in 2003; We discussed checking UA and Cult (+EColi- pan sens) and treating w/ empiric SeptraDS until cult avail...    >HBP controlled on DiovanHCT w/ BP= 110/70 today & he denies CP, palpit, SOB, edema, etc...    >Chol was at goal on Lip80 but he stopped 1/12 due to muscles cramps which resolved off the Rx; f/u FLP 4/12 looked terrible & he was placed on Cres20; f/u FLP on Cres20 is pending...    >DM treated w/ diet, exercise, & MetforminER 500mg /d started 4/12> he notes home BS ave ~130 & labs today showed BS=151, A1c=7.1 improved from 7.3;  continue same med, get on diet, get wt down...  ~  August 13, 2011:  88mo ROV & [name] is c/o sinusitis w/ yellow drainage & wants a ZPak, he says;  BP controlled on meds & he denies CP, palpit, dizzy, SOB, or edema;  See problem list below>>    >Wife is c/o his snoring & sleep apnea which she feels is getting worse; now he endorses some daytime hypersomnolence & sleep pressure; I told him to avoid driving any distances & sched Sleep Study ASAP; we reviewed diet & exercise program- wt reduction is paramount & he understands...    >He was also intol to the Crestor20 stating that it caused cramping all over & he stopped it after several months (never even got f/u FLP on the med); we called in the ultra-low-dose Livalo 1mg  & he has been tolerating this; FLP today showed TChol 222, TG 106, HDL 39, LDL 156...    >His wt is up 2# further to 241# & not really on diet/ exercising/ etc; FBS at home averages 120-130 he says; Labs today> FBS= 171, A1c= 7.4 and we will recommend increasing his METFORMIN to 500mg  Bid...    >He also notes that his energy is low, ?lost his drive etc; he is  on Flomax0.4 & voiding prev symptoms are improved; we discussed checking for "Low-T" (Testos level = 350 (norm 350-900)...    >He tells me he fell w/ rotator cuff injury per DrWainer (we don't have notes from him); no surg yet, he had phys therapy & improved... LABS 1/13:  FLP- [id number] on diet;  Chems- ok x BS171 A1c7.4 on Metform500/d;  CBC- ok;  TSH=2.25;  PSA=1.55  ~  November 12, 2011:  242mo ROV & here for CPX> Thorne states that he is doing well, feels good w/o new complaints or concerns;  He saw DrClance 2/13 for Sleep eval> OSA w/ sleep study (split night study) showing AHI=69 & titrated on CPAP to optimal pressure of 17;  He set up CPAP w/ mod pressure level for desensitization & will titrate it later; pt asked to work on weight reduction...  We reviewed prob list, meds, XRays & Labs> see prob list below>> CXR 4/13 showed  normal heart size, clear lungs, NAD.Marland KitchenMarland Kitchen EKG 4/13 showed NSR, rate68, WNL.Marland KitchenMarland Kitchen LABS 4/13:  FLP- [id number] on Liv1mg ;  Chems- ok x BS144 A1c6.7 on Metform;  Testos=331   Problem List:    << PROBLEM LIST UPDATED 11/12/11 >>  Hx of OBSTRUCTIVE SLEEP APNEA (ICD-327.23) - eval in 1996 w/ sleep study showing RDI= 19, mod snoring, O2 sat to 90%... saw DrRedman for ENT due to snoring & tonsillar hypertrophy- offered UPPP surg but pt declined... since then he's gained weight, continued snoring & denied hypersom etc> not interested in further evaluation until wife c/o his snoring & restless sleeping;  Repeat seep study 1/13 revealed AHI=69 & desat to 72% w/ the obstructive events, split night protocol w/ CPAP titrate to 17cmH2O pressure... ~  4/12:  Pt states that wife does c/o his snoring but he states he rests well, wakes refreshed, no daytime symptoms... ~  1/13:  Repeat seep study 1/13 revealed AHI=69 & desat to 72% w/ the obstructive events, split night protocol w/ CPAP titrate to 17cmH2O pressure; He saw DrClance 2/13 & set up CPAP w/ mod pressure level for desensitization & will titrate it later; pt asked to work on weight reduction... ~  4/13: pt states that he is doing well on the CPAP; resting better, wakes refreshed, no further daytime hypersomnolence, sleep pressure, etc...  HYPERTENSION (ICD-401.9) - on DIOVAN/Hct 160-25 daily... ~  baseline EKG showed NSR, WNL.[name]. ~  baseline CXR showed clear, NAD; DJD spine. ~  1/13:  BP= 118/78 & feeling ok; denies A, CP, palpit, SOB, swelling, etc... ~  4/13:  BP= 138/80 & he remains largely asymptomatic...  HYPERCHOLESTEROLEMIA (ICD-272.0) - prev on Lip80, pt stopped on his own 1/12 due to cramping (resolved off the med); NOTE: he had mild incr LFTs as well while on the Lip80 & these ret to normal off the med; He notes similar cramping all over from Crestor20; Now on LIVALO 1mg /d & tol ok so far... ~  FLP 7/09 on Lip80 showed TChol 165, TG 165, HDL 37, LDL 95...  same med, better diet, lose wt. ~  FLP 6/10 on Lip80 showed TChol 148, TG 74, HDL 44, LDL 90 ~  FLP 6/11 on Lip80 showed TChol 180, TG 148, HDL 43, LDL 108... needs to get wt down! ~  FLP 4/12 on diet alone showed TChol 257, TG 202, HDL41, LDL176... rec to start Crestor 20mg /d. ~  9/12 OV ==> not fasting for FLP today & he stopped Cres20 after 42mo due to cramping all over; rec trial  LIVALO 1mg . ~  FLP 1/13 on Livalo1mg  showed TChol 222, TG 106, HDL 39, LDL 156... Improved but not at goal, continue same med & get wt down! ~  FLP 4/13 on Liv1mg  showed TChol 191, TG 114, HDL 41, LDL 127... imoroved further, continue same, better diet, get wt down!  DIABETES MELLITUS >> hx elev FBS in past> diet Rx; started on METFORMIN  ER 500mg Qam 4/12 w/ A1c up to 7.3.Marland KitchenMarland Kitchen ~  labs 6/10 showed BS= 111, A1c= 6.4... On diet alone. ~  labs 6/11 showed BS= 148, A1c= 6.6.Marland KitchenMarland Kitchen may need meds, get weight down. ~  labs 4/12 on diet alone showed BS= 132, A1c= 7.3.Marland KitchenMarland Kitchen rec to start MetforminER 500mg Qam. ~  Labs 9/12 on MetformER500/d showed BS=151, A1c=7.1.Marland KitchenMarland Kitchen Continue same... ~  Labs 1/13 on MetformER500/d showed BS=171, A1c=7.4.Marland KitchenMarland Kitchen rec to incr Metformin to 500mg  Bid. ~  Labs 4/13 on Metform500Bid showed BS= 144, A1c= 6.7.Marland KitchenMarland Kitchen Continue same, GET WT DOWN!  OVERWEIGHT (ICD-278.02) - as above: we discussed diet, exercise, get weight down... ~  weight 7/09 = 247# ~  weight 6/10 = 241# ~  weight 6/11 = 240# ~  Weight 4/12 = 244# ~  Weight 9/12 = 239# ~  Weight 1/13 = 241# ~  Weight 4/13 = 236#  DIVERTICULOSIS OF COLON (ICD-562.10) & COLONIC POLYPS (ICD-211.3) ~  colonoscopy 7/07 by DrStark showed 7mm polyp= tubular adenoma... ~  colonoscopy 11/10 showed divertics, polyp= tub adenoma, +hems... f/u planned 17yrs.  LIVER FUNCTION TESTS, ABNORMAL, HX OF (ICD-V12.2) - could be Statin related vs Steatosis... ~  labs 6/10 showed AlkPhos=71, SGOT=47, SGPT=65 ~  labs 6/11 showed AlkPhos=61, SGOT=39, SGPT=57 ~  Labs 4/12 off statin Rx  showed SGOT=30, SGPT=50 ~  Labs 1/13 on Livalo1 showed SGOT=38, SGPT=63 ~  Labs 4/13 on Livalo1 showed SGOT= 27, SGPT= 35... WNL on low dose Livalo.  BENIGN PROSTATIC HYPERTROPHY, HX OF (ICD-V13.8) - hx of elevated PSA w/ neg prostate bx's from [facility] in 2003 (PSA was 17)... ~  PSA 5/08 was 0.66... ~  PSA 7/09 is 0.52... ~  Eval 6/10 DrNesi: doing well, PSA reported 0.90 ~  labs 6/11 showed PSA= 0.59 ~  Labs 4/12 showed PSA= 0.68 ~  Labs 1/13 showed PSA= 1.55 & Testos level = 350 ~  Labs 4/13 showed Testos level = 331 & pt reports good energy, feeling well.  DEGENERATIVE JOINT DISEASE (ICD-715.90) - he uses ETODOLAC as needed.  Health Maintenance>  ~  GI:  Followed byDrStark w/ hx adenomatous polyps on colon check 2007 & 2010> f/u due 11/15 ~  GU:  Followed by Thermon Leyland w/ hx elev PSA & neg bx 2003; yearly PSAs all <1 since then & PSA 1/13= 1.55 ~  Immuniz:  He had Pneumovax in 2000 at age 40, repeat due when he turns 63;He refuses the seasonal FLU vaccine; Due for TDAP but wants to wait.   Past Surgical History  Procedure Date  . Vasectomy     by Dr. Brunilda Payor    Outpatient Encounter Prescriptions as of 11/12/2011  Medication Sig Dispense Refill  . cetirizine (ZYRTEC) 10 MG tablet Take 10 mg by mouth daily as needed.       . clotrimazole-betamethasone (LOTRISONE) cream Apply as directed  15 g  1  . etodolac (LODINE) 400 MG tablet Take 1 tablet (400 mg total) by mouth 2 (two) times daily. As needed for arthritis pain  180 tablet  3  . glucose blood test strip 1 each by Other route as  needed. Use as instructed       . glucose blood test strip Use as instructed  100 each  0  . Lancets (FREESTYLE) lancets Use as instructed  100 each  0  . metFORMIN (GLUCOPHAGE) 500 MG tablet Take 1 tablet (500 mg total) by mouth 2 (two) times daily with a meal.  180 tablet  3  . multivitamin (THERAGRAN) per tablet Take 1 tablet by mouth daily.        . Pitavastatin Calcium 1 MG TABS Take 1 tablet (1 mg  total) by mouth at bedtime.  90 tablet  3  . sildenafil (VIAGRA) 100 MG tablet Take 100 mg by mouth daily as needed.        . Tamsulosin HCl (FLOMAX) 0.4 MG CAPS Take 1 capsule (0.4 mg total) by mouth at bedtime.  90 capsule  3  . valsartan-hydrochlorothiazide (DIOVAN-HCT) 160-25 MG per tablet Take 1 tablet by mouth daily.  90 tablet  3    No Known Allergies   Current Medications, Allergies, Past Medical History, Past Surgical History, Family History, and Social History were reviewed in [employer] record.    Review of Systems       The patient complains of some arthritis.  The patient denies fever, chills, sweats, anorexia, fatigue, weakness, malaise, weight loss, sleep disorder, blurring, diplopia, eye irritation, eye discharge, vision loss, eye pain, photophobia, earache, ear discharge, tinnitus, decreased hearing, nasal congestion, nosebleeds, sore throat, hoarseness, chest pain, syncope, dyspnea on exertion, orthopnea, PND, peripheral edema, cough, dyspnea at rest, excessive sputum, hemoptysis, wheezing, pleurisy, nausea, vomiting, diarrhea, constipation, change in bowel habits, abdominal pain, melena, hematochezia, jaundice, gas/bloating, indigestion/heartburn, dysphagia, odynophagia, dysuria, hematuria, urinary frequency, urinary hesitancy, nocturia, incontinence, back pain, joint pain, joint swelling, muscle cramps, muscle weakness, stiffness, sciatica, restless legs, leg pain at night, leg pain with exertion, rash, itching, dryness, suspicious lesions, paralysis, paresthesias, seizures, tremors, vertigo, transient blindness, frequent falls, frequent headaches, difficulty walking, depression, anxiety, memory loss, confusion, cold intolerance, heat intolerance, polydipsia, polyphagia, polyuria, unusual weight change, abnormal bruising, bleeding, enlarged lymph nodes, urticaria, allergic rash, hay fever, and recurrent infections.   Objective:   Physical Exam     WD,  Overweight, 60 y/o BM in NAD... Vital Signs:  Reviewed w/ pt... GENERAL:  Alert & oriented; pleasant & cooperative... HEENT:  [place]/AT, EOM-wnl, PERRLA, EACs- +cerumen, TMs-wnl, NOSE-clear, THROAT-clear & wnl. NECK:  Supple w/ full ROM; no JVD; normal carotid impulses w/o bruits; no thyromegaly or nodules palpated; no lymphadenopathy. CHEST:  Clear to P & A; without wheezes/ rales/ or rhonchi heard... HEART:  Regular Rhythm; without murmurs/ rubs/ or gallops detected... ABDOMEN:  Soft & nontender; normal bowel sounds; no organomegaly or masses palpated... RECTAL:  Sl boggy & tender prostate... EXT: without deformities or arthritic changes; no varicose veins/ venous insuffic/ or edema. NEURO:  CN's intact; motor testing normal; sensory testing normal; gait normal & balance OK DERM:  No lesions noted; no rash etc...  RADIOLOGY DATA:  Reviewed in the EPIC EMR & discussed w/ the patient...  LABORATORY DATA:  Reviewed in the EPIC EMR & discussed w/ the patient...   Assessment & Plan:   OSA>  Sleep study was pos for OSA w/ AHI=69 & desat to 72%; started on CPAP via full face mask titrated to CPAP=17...  HBP>  Controlled on Diovan/HCT;  Continue same meds, needs better diet, get wt down!  CHOL>  Lipids are improved but not at goals on Livalo 1mg Qhs; rec same  med, better diet & must get wt down or incr med next time...  DM>  FBS now 144 & A1c=6.7 (on MetformER500Bid);  He hasn't been able to effectively diet or exercise;  He needs to lose a substantial amt of weight & we discussed 10-15 lb increments; in the meanwhile> continue same med...  UTI, BPH, Hx elev PSA>  Prev UTI symptoms & PROSTATITIS (EColi, pan-sens & ok to treat w/ SeptraDS vs Cipro) resolved & current UA C&S is neg==> resolved;  We will also give a trial of FLOMAX 0.4mg /d...  Other medical problems as noted...   Patient's Medications  New Prescriptions   No medications on file  Previous Medications   CETIRIZINE (ZYRTEC) 10 MG  TABLET    Take 10 mg by mouth daily as needed.    CLOTRIMAZOLE-BETAMETHASONE (LOTRISONE) CREAM    Apply as directed   GLUCOSE BLOOD TEST STRIP    1 each by Other route as needed. Use as instructed    GLUCOSE BLOOD TEST STRIP    Use as instructed   LANCETS (FREESTYLE) LANCETS    Use as instructed   METFORMIN (GLUCOPHAGE) 500 MG TABLET    Take 1 tablet (500 mg total) by mouth 2 (two) times daily with a meal.   MULTIVITAMIN (THERAGRAN) PER TABLET    Take 1 tablet by mouth daily.     SILDENAFIL (VIAGRA) 100 MG TABLET    Take 100 mg by mouth daily as needed.    Modified Medications   Modified Medication Previous Medication   ETODOLAC (LODINE) 400 MG TABLET etodolac (LODINE) 400 MG tablet      Take 1 tablet (400 mg total) by mouth 2 (two) times daily. As needed for arthritis pain    Take 1 tablet (400 mg total) by mouth 2 (two) times daily. As needed for arthritis pain   PITAVASTATIN CALCIUM 1 MG TABS Pitavastatin Calcium 1 MG TABS      Take 1 tablet (1 mg total) by mouth at bedtime.    Take 1 tablet (1 mg total) by mouth at bedtime.   TAMSULOSIN HCL (FLOMAX) 0.4 MG CAPS Tamsulosin HCl (FLOMAX) 0.4 MG CAPS      Take 1 capsule (0.4 mg total) by mouth at bedtime.    Take 1 capsule (0.4 mg total) by mouth at bedtime.   VALSARTAN-HYDROCHLOROTHIAZIDE (DIOVAN-HCT) 160-25 MG PER TABLET valsartan-hydrochlorothiazide (DIOVAN-HCT) 160-25 MG per tablet      Take 1 tablet by mouth daily.    Take 1 tablet by mouth daily.  Discontinued Medications   No medications on file

## 2011-11-26 ENCOUNTER — Encounter: Payer: Self-pay | Admitting: Pulmonary Disease

## 2011-11-26 ENCOUNTER — Ambulatory Visit (INDEPENDENT_AMBULATORY_CARE_PROVIDER_SITE_OTHER): Payer: Federal, State, Local not specified - PPO | Admitting: Pulmonary Disease

## 2011-11-26 VITALS — BP 122/74 | HR 74 | Temp 98.1°F | Ht 71.0 in | Wt 237.4 lb

## 2011-11-26 DIAGNOSIS — G4733 Obstructive sleep apnea (adult) (pediatric): Secondary | ICD-10-CM

## 2011-11-26 NOTE — Assessment & Plan Note (Signed)
The patient is doing well on CPAP, and feels that his sleep and daytime alertness are much improved.  During his titration study, his optimal CPAP appeared to be 17 cm, but he did have pressure induced centrals.  For completeness, I would like to check his pressures on the auto setting for the next 2 weeks and see what level we come up with.  I will let the patient know the results.  I have also encouraged him to work aggressively on weight loss.

## 2011-11-26 NOTE — Progress Notes (Signed)
  Subjective:    Patient ID: Levi Lee, male    DOB: October 19, 1950, 61 y.o.   MRN: [id number]  HPI The patient comes in today for followup of his obstructive sleep apnea.  He has been wearing CPAP compliantly since the last visit, and feels that he is much improved with respect to sleep and daytime alertness.  He denies any issues with his mask fit or pressure.   Review of Systems  Constitutional: Negative for fever and unexpected weight change.  HENT: Positive for congestion. Negative for ear pain, nosebleeds, sore throat, rhinorrhea, sneezing, trouble swallowing, dental problem, postnasal drip and sinus pressure.   Eyes: Negative for redness and itching.  Respiratory: Negative for cough, chest tightness, shortness of breath and wheezing.   Cardiovascular: Negative for palpitations and leg swelling.  Gastrointestinal: Negative for nausea and vomiting.  Genitourinary: Negative for dysuria.  Musculoskeletal: Negative for joint swelling.  Skin: Negative for rash.  Neurological: Negative for headaches.  Hematological: Does not bruise/bleed easily.  Psychiatric/Behavioral: Negative for dysphoric mood. The patient is not nervous/anxious.        Objective:   Physical Exam Overweight male in no acute distress No skin breakdown or pressure necrosis from the CPAP mask Lower extremities without edema, no cyanosis Alert and oriented, moves all 4 extremities.  He is mildly sleepy, but not significantly so.       Assessment & Plan:

## 2011-11-26 NOTE — Patient Instructions (Signed)
Will have your pressure optimized for you on the auto setting for the next 2 weeks.  I will call you with the optimal pressure.  Work on weight loss If doing well, followup with me in 6mos.

## 2011-12-10 ENCOUNTER — Encounter: Payer: Self-pay | Admitting: Pulmonary Disease

## 2011-12-11 ENCOUNTER — Telehealth: Payer: Self-pay | Admitting: Pulmonary Disease

## 2011-12-11 DIAGNOSIS — G4733 Obstructive sleep apnea (adult) (pediatric): Secondary | ICD-10-CM

## 2011-12-11 NOTE — Telephone Encounter (Signed)
I spoke with the pt and advised him of recs below. Pt states that it is fine to send the order but he wants to know can an order also be sent to have his cpap changed back to the setting it was on so he can at least use the machine while he waits for the download results. He states he cannot wear it at all with the way it is right now. He states he felt he was doing well at the previous setting.  I advised the pt I will ask Dr. Shelle Iron if this is a possibility. Please advise. Carron Curie, CMA  Order has not been placed for download yet. Waiting on response. Carron Curie, CMA

## 2011-12-11 NOTE — Telephone Encounter (Signed)
Ok to have them set on 10cm while waiting on me to analyze download.

## 2011-12-11 NOTE — Telephone Encounter (Signed)
Let pt know the machine adjusts itself to treat the severity of his sleep apnea.  If the pressure goes that high, that means it is taking that much pressure to control his events.  I would like to get a download off his machine while on auto to see the ranges of pressure, then we can set to a reasonable level that hopefully he can tolerate. Please see if the dme can get download off his machine from where he has been on auto only!  Have them fax to [place].

## 2011-12-11 NOTE — Telephone Encounter (Signed)
Called spoke with patient who stated that the pressure on his CPAP was adjusted on yesterday and is now "way too high" and causes leaking around his mask.  Pt stated that he did not wear his CPAP last night d/t this.  Pt is requesting the pressure be readjusted.  Uses [facility].  Pt also stated that he is out of the office today, out of town but has left his CPAP at his work place so that dme can adjust and he will pick it up tonight when he returns.  Dr Shelle Iron please advise, thanks.

## 2011-12-11 NOTE — Telephone Encounter (Signed)
Pt advised. Order placed. Carron Curie, CMA

## 2011-12-12 ENCOUNTER — Telehealth: Payer: Self-pay | Admitting: Pulmonary Disease

## 2011-12-12 NOTE — Telephone Encounter (Signed)
wll forward  Message to [facility] as FYI that there will only be 1 night for download.

## 2011-12-18 ENCOUNTER — Telehealth: Payer: Self-pay | Admitting: Pulmonary Disease

## 2011-12-18 MED ORDER — METHYLPREDNISOLONE 4 MG PO KIT: PACK | ORAL | Status: AC

## 2011-12-18 NOTE — Telephone Encounter (Signed)
I spoke with pt and he c/o sinus pressure, nasal congestion, blowing out clear phlem, sneezing a lot x couple weeks. Denies any PND, c/s/n/v/f/HA/sore throat/watery/itchy eyes. Pt is using afrin and has been taking zyrtec w/o relief. Please advise SN thanks  No Known Allergies

## 2011-12-18 NOTE — Telephone Encounter (Signed)
Per SN---ok for medrol dosepak  #1  Take as directed with no refills.  Spray nasal saline every 1-2 hours while awake and take otc allegra 180mg   1 tablet by mouth every morning.  thanks

## 2011-12-18 NOTE — Telephone Encounter (Signed)
I spoke with pt and is aware of SN recs. Rx has been sent to the pharmacy 

## 2012-01-11 ENCOUNTER — Other Ambulatory Visit: Payer: Self-pay | Admitting: Pulmonary Disease

## 2012-01-11 DIAGNOSIS — G4733 Obstructive sleep apnea (adult) (pediatric): Secondary | ICD-10-CM

## 2012-02-07 ENCOUNTER — Telehealth: Payer: Self-pay | Admitting: Pulmonary Disease

## 2012-02-07 MED ORDER — METHYLPREDNISOLONE 4 MG PO KIT: PACK | ORAL | Status: AC

## 2012-02-07 NOTE — Telephone Encounter (Signed)
I spoke with pt and he c/o nasal congestion, can't breathe out of nose, slight sinus pressure, slight PND x off and on 2 weeks. Denies any f/c/s/n/v. Using zyrtec and afrin. Pt is requesting rx for this. Please advise SN, thanks  No Known Allergies

## 2012-02-07 NOTE — Telephone Encounter (Signed)
Per SN: stop the afrin--use saline nasal spray q1 hour while awake, medrol dose pack and mucinex OTC 2 po BID  I spoke with pt and is aware of SN recs. I have sent rx into the pharmacy and nothing further was needed

## 2012-04-24 ENCOUNTER — Telehealth: Payer: Self-pay | Admitting: Pulmonary Disease

## 2012-04-24 NOTE — Telephone Encounter (Signed)
LMTCB

## 2012-04-27 MED ORDER — METHYLPREDNISOLONE 4 MG PO KIT: PACK | ORAL | Status: DC

## 2012-04-27 MED ORDER — AZITHROMYCIN 250 MG PO TABS: ORAL_TABLET | ORAL | Status: DC

## 2012-04-27 NOTE — Telephone Encounter (Signed)
Per SN---call in medrol dosepak  #1  Take as directed with no refills and zpak #1  Take as directed with 2 refills please.  thanks

## 2012-04-27 NOTE — Telephone Encounter (Signed)
Called, spoke with pt.  C/o nasal congestion with thick, yellow mucus, runny nose, wheezing, and sinus pressure x 2 wks.  Denies PND, increased SOB, f/c/s.  Taking mucinex d and allegra d with no relief.  Requesting further recs -- Dr. Kriste Basque, pls advise.  Thank you.    Last OV with SN 11/12/11 Pending OV with SN 05/14/12   [facility]  nkda - verified

## 2012-04-27 NOTE — Telephone Encounter (Signed)
Patient is aware of prescriptions and they have been sent to his pharmacy.

## 2012-05-13 ENCOUNTER — Encounter: Payer: Self-pay | Admitting: *Deleted

## 2012-05-14 ENCOUNTER — Other Ambulatory Visit (INDEPENDENT_AMBULATORY_CARE_PROVIDER_SITE_OTHER): Payer: Federal, State, Local not specified - PPO

## 2012-05-14 ENCOUNTER — Encounter: Payer: Self-pay | Admitting: Pulmonary Disease

## 2012-05-14 ENCOUNTER — Ambulatory Visit (INDEPENDENT_AMBULATORY_CARE_PROVIDER_SITE_OTHER): Payer: Federal, State, Local not specified - PPO | Admitting: Pulmonary Disease

## 2012-05-14 VITALS — BP 108/62 | HR 82 | Temp 97.1°F | Ht 71.0 in | Wt 231.0 lb

## 2012-05-14 DIAGNOSIS — E7800 Pure hypercholesterolemia, unspecified: Secondary | ICD-10-CM

## 2012-05-14 DIAGNOSIS — M1990 Unspecified osteoarthritis, unspecified site: Secondary | ICD-10-CM

## 2012-05-14 DIAGNOSIS — Z87898 Personal history of other specified conditions: Secondary | ICD-10-CM

## 2012-05-14 DIAGNOSIS — I10 Essential (primary) hypertension: Secondary | ICD-10-CM

## 2012-05-14 DIAGNOSIS — E119 Type 2 diabetes mellitus without complications: Secondary | ICD-10-CM

## 2012-05-14 DIAGNOSIS — G4733 Obstructive sleep apnea (adult) (pediatric): Secondary | ICD-10-CM

## 2012-05-14 DIAGNOSIS — E663 Overweight: Secondary | ICD-10-CM

## 2012-05-14 LAB — BASIC METABOLIC PANEL
BUN: 22 mg/dL (ref 6–23)
Chloride: 100 mEq/L (ref 96–112)
GFR: 79.35 mL/min (ref >60.00)
Glucose, Bld: 122 mg/dL — ABNORMAL HIGH (ref 70–99)
Potassium: 4.4 mEq/L (ref 3.5–5.1)

## 2012-05-14 MED ORDER — AZITHROMYCIN 250 MG PO TABS: ORAL_TABLET | ORAL | Status: DC

## 2012-05-14 NOTE — Progress Notes (Signed)
Subjective:    Patient ID: Levi Lee, male    DOB: [birth year], 61 y.o.   MRN: [id number]  HPI 61 y/o BM here for a folllow up visit... He has mult medical problems as noted below> he has a sister who is a primary care doctor in [place] & a son-in-law who is a nephrologist in [facility]...  ~  January 10, 2010:  Yearly ROV doing satis & his CC is paresthesias in toes> could be early neuropathy w/ his mild gluc intol & offered Lyrica trial but it's not that bad & he wants to watch it... he notes rare episodes of self-limited palpit> rapid regular & review of hx shows episode of PAT yrs ago Rx in ER w/ "shot" converted to NSR... we discussed no caffeine etc & he will let us know if symptoms worsen...  ~  November 06, 2010:  37mo ROV and he reports doing satis w/o new complaints or concerns> he stopped his Lipitor80 about 3 months ago he says due to cramping in his hands & feet (resolved off the med), he is willing to consider a diff med if necessary based on  today's fasting blood work (Chol=267 & rec to try Cres20);  BP controlled on med but unfortunately he has not lost any wt> we reviewed diet & exercise prescription...  FBS remains elevated & A1c=7.3 therefore start MetforminER 500mg Qam; he really needs to do better & he understands the risks...  ~  April 10, 2011:  18mo ROV & add-on for urinary symptoms> c/o 3-4d hx slow stream, hesitant w/ freq 4-5 days, 2-3 nights but he denies incont, dysuria, fever, etc;  He has hx BPH & has seen DrNesi in past w/ elev PSA & neg bx in 2003; We discussed checking UA and Cult (+EColi- pan sens) and treating w/ empiric SeptraDS until cult avail...    >HBP controlled on DiovanHCT w/ BP= 110/70 today & he denies CP, palpit, SOB, edema, etc...    >Chol was at goal on Lip80 but he stopped 1/12 due to muscles cramps which resolved off the Rx; f/u FLP 4/12 looked terrible & he was placed on Cres20; f/u FLP on Cres20 is pending...    >DM treated w/ diet, exercise, & MetforminER  500mg /d started 4/12> he notes home BS ave ~130 & labs today showed BS=151, A1c=7.1 improved from 7.3; continue same med, get on diet, get wt down...  ~  August 13, 2011:  42mo ROV & Levi Lee is c/o sinusitis w/ yellow drainage & wants a ZPak, he says;  BP controlled on meds & he denies CP, palpit, dizzy, SOB, or edema;  See problem list below>>    >Wife is c/o his snoring & sleep apnea which she feels is getting worse; now he endorses some daytime hypersomnolence & sleep pressure; I told him to avoid driving any distances & sched Sleep Study ASAP; we reviewed diet & exercise program- wt reduction is paramount & he understands...    >He was also intol to the Crestor20 stating that it caused cramping all over & he stopped it after several months (never even got f/u FLP on the med); we called in the ultra-low-dose Livalo 1mg  & he has been tolerating this; FLP today showed TChol 222, TG 106, HDL 39, LDL 156...    >His wt is up 2# further to 241# & not really on diet/ exercising/ etc; FBS at home averages 120-130 he says; Labs today> FBS= 171, A1c= 7.4 and we will recommend increasing his METFORMIN  to 500mg  Bid...    >He also notes that his energy is low, ?lost his drive etc; he is on ZOXWRU0.4 & voiding prev symptoms are improved; we discussed checking for "Low-T" (Testos level = 350 (norm 350-900)...    >He tells me he fell w/ rotator cuff injury per DrWainer (we don't have notes from him); no surg yet, he had phys therapy & improved... LABS 1/13:  FLP- [id number] on diet;  Chems- ok x BS171 A1c7.4 on Metform500/d;  CBC- ok;  TSH=2.25;  PSA=1.55  ~  November 12, 2011:  40mo ROV & here for CPX> Levi Lee states that he is doing well, feels good w/o new complaints or concerns;  He saw DrClance 2/13 for Sleep eval> OSA w/ sleep study (split night study) showing AHI=69 & titrated on CPAP to optimal pressure of 17;  He set up CPAP w/ mod pressure level for desensitization & will titrate it later; pt asked to work on  weight reduction...  We reviewed prob list, meds, XRays & Labs> see prob list below>> CXR 4/13 showed normal heart size, clear lungs, NAD.Marland KitchenMarland Kitchen EKG 4/13 showed NSR, rate68, WNL.Marland KitchenMarland Kitchen LABS 4/13:  FLP- FAO130 on Liv1mg ;  Chems- ok x BS144 A1c6.7 on Metform;  Testos=331  ~  May 14, 2012:  6mo ROV & Levi Lee is c/o his allergies acting up this fall- using OTC antihist as needed; also wants ZPak to keep on hand for the winter...    HBP is well controlled on DiovanHCT;  He has tolerated the Livalo1mg  for his chol (didn't tol Crestor) & labs improved but not at goals, not fasting today for FLP & we stressed need for diet & wt loss;  DM fair on Metform monotherapy> BS=122, A1c=7.2> need to get wt down!...    Other med problems as noted below...    We reviewed prob list, meds, xrays and labs> see below for updates >> he declines the Flu vaccine.         Problem List:     Hx of OBSTRUCTIVE SLEEP APNEA (ICD-327.23) - eval in 1996 w/ sleep study showing RDI= 19, mod snoring, O2 sat to 90%... saw DrRedman for ENT due to snoring & tonsillar hypertrophy- offered UPPP surg but pt declined... since then he's gained weight, continued snoring & denied hypersom etc> not interested in further evaluation until wife c/o his snoring & restless sleeping;  Repeat seep study 1/13 revealed AHI=69 & desat to 72% w/ the obstructive events, split night protocol w/ CPAP titrate to 17cmH2O pressure... ~  4/12:  Pt states that wife does c/o his snoring but he states he rests well, wakes refreshed, no daytime symptoms... ~  1/13:  Repeat seep study 1/13 revealed AHI=69 & desat to 72% w/ the obstructive events, split night protocol w/ CPAP titrate to 17cmH2O pressure; He saw DrClance 2/13 & set up CPAP w/ mod pressure level for desensitization & will titrate it later; pt asked to work on weight reduction... ~  4/13: pt states that he is doing well on the CPAP; resting better, wakes refreshed, no further daytime hypersomnolence, sleep  pressure, etc...  HYPERTENSION (ICD-401.9) - on DIOVAN/Hct 160-25 daily... ~  baseline EKG showed NSR, WNL.[name]. ~  baseline CXR showed clear, NAD; DJD spine. ~  1/13:  BP= 118/78 & feeling ok; denies A, CP, palpit, SOB, swelling, etc... ~  4/13:  BP= 138/80 & he remains largely asymptomatic... ~  10/13:  BP= 110/62 & he denies HA, CP, palpit, SOB, edema, etc..[name]  HYPERCHOLESTEROLEMIA (ICD-272.0) - prev on Lip80, pt stopped on his own 1/12 due to cramping (resolved off the med); NOTE: he had mild incr LFTs as well while on the Lip80 & these ret to normal off the med; He notes similar cramping all over from Crestor20; Now on LIVALO 1mg /d & tol ok so far... ~  FLP 7/09 on Lip80 showed TChol 165, TG 165, HDL 37, LDL 95... same med, better diet, lose wt. ~  FLP 6/10 on Lip80 showed TChol 148, TG 74, HDL 44, LDL 90 ~  FLP 6/11 on Lip80 showed TChol 180, TG 148, HDL 43, LDL 108... needs to get wt down! ~  FLP 4/12 on diet alone showed TChol 257, TG 202, HDL41, LDL176... rec to start Crestor 20mg /d. ~  9/12 OV ==> not fasting for FLP today & he stopped Cres20 after 40mo due to cramping all over; rec trial LIVALO 1mg . ~  FLP 1/13 on Livalo1mg  showed TChol 222, TG 106, HDL 39, LDL 156... Improved but not at goal, continue same med & get wt down! ~  FLP 4/13 on Liv1mg  showed TChol 191, TG 114, HDL 41, LDL 127... imoroved further, continue same, better diet, get wt down!  DIABETES MELLITUS >> hx elev FBS in past> diet Rx; started on METFORMIN ER 500mg Qam 4/12 w/ A1c up to 7.3.Marland KitchenMarland Kitchen ~  labs 6/10 showed BS= 111, A1c= 6.4... On diet alone. ~  labs 6/11 showed BS= 148, A1c= 6.6.Marland KitchenMarland Kitchen may need meds, get weight down. ~  labs 4/12 on diet alone showed BS= 132, A1c= 7.3.Marland KitchenMarland Kitchen rec to start MetforminER 500mg Qam. ~  Labs 9/12 on MetformER500/d showed BS=151, A1c=7.1.Marland KitchenMarland Kitchen Continue same... ~  Labs 1/13 on MetformER500/d showed BS=171, A1c=7.4.Marland KitchenMarland Kitchen rec to incr Metformin to 500mg  Bid. ~  Labs 4/13 on Metform500Bid showed BS= 144, A1c=  6.7.Marland KitchenMarland Kitchen Continue same, GET WT DOWN! ~  Labs 10/13 on Metform500Bid showed BS= 122, A1c= 7.2... Again we stress wt reduction...  OVERWEIGHT (ICD-278.02) - as above: we discussed diet, exercise, get weight down... ~  weight 7/09 = 247# ~  weight 6/10 = 241# ~  weight 6/11 = 240# ~  Weight 4/12 = 244# ~  Weight 9/12 = 239# ~  Weight 1/13 = 241# ~  Weight 4/13 = 236# ~  Weight 10/13 = 231#  DIVERTICULOSIS OF COLON (ICD-562.10) & COLONIC POLYPS (ICD-211.3) ~  colonoscopy 7/07 by DrStark showed 7mm polyp= tubular adenoma... ~  colonoscopy 11/10 showed divertics, polyp= tub adenoma, +hems... f/u planned 41yrs.  LIVER FUNCTION TESTS, ABNORMAL, HX OF (ICD-V12.2) - could be Statin related vs Steatosis... ~  labs 6/10 showed AlkPhos=71, SGOT=47, SGPT=65 ~  labs 6/11 showed AlkPhos=61, SGOT=39, SGPT=57 ~  Labs 4/12 off statin Rx showed SGOT=30, SGPT=50 ~  Labs 1/13 on Livalo1 showed SGOT=38, SGPT=63 ~  Labs 4/13 on Livalo1 showed SGOT= 27, SGPT= 35... WNL on low dose Livalo.  BENIGN PROSTATIC HYPERTROPHY, HX OF (ICD-V13.8) - hx of elevated PSA w/ neg prostate bx's from [facility] in 2003 (PSA was 17)... ~  PSA 5/08 was 0.66... ~  PSA 7/09 is 0.52... ~  Eval 6/10 DrNesi: doing well, PSA reported 0.90 ~  labs 6/11 showed PSA= 0.59 ~  Labs 4/12 showed PSA= 0.68 ~  Labs 1/13 showed PSA= 1.55 & Testos level = 350 ~  Labs 4/13 showed Testos level = 331 & pt reports good energy, feeling well.  DEGENERATIVE JOINT DISEASE (ICD-715.90) - he uses ETODOLAC as needed.  Health Maintenance>  ~  GI:  Followed  byDrStark w/ hx adenomatous polyps on colon check 2007 & 2010> f/u due 11/15 ~  GU:  Followed by Thermon Leyland w/ hx elev PSA & neg bx 2003; yearly PSAs all <1 since then & PSA 1/13= 1.55 ~  Immuniz:  He had Pneumovax in 2000 at age 2, repeat due when he turns 63;He refuses the seasonal FLU vaccine; Due for TDAP but wants to wait.   Past Surgical History  Procedure Date  . Vasectomy     by Dr. Brunilda Payor     Outpatient Encounter Prescriptions as of 05/14/2012  Medication Sig Dispense Refill  . cetirizine (ZYRTEC) 10 MG tablet Take 10 mg by mouth daily as needed.       . clotrimazole-betamethasone (LOTRISONE) cream Apply as directed  15 g  1  . etodolac (LODINE) 400 MG tablet Take 1 tablet (400 mg total) by mouth 2 (two) times daily. As needed for arthritis pain  180 tablet  3  . glucose blood test strip 1 each by Other route as needed. Use as instructed       . Lancets (FREESTYLE) lancets Use as instructed  100 each  0  . metFORMIN (GLUCOPHAGE) 500 MG tablet Take 1 tablet (500 mg total) by mouth 2 (two) times daily with a meal.  180 tablet  3  . multivitamin (THERAGRAN) per tablet Take 1 tablet by mouth daily.        . Pitavastatin Calcium 1 MG TABS Take 1 tablet (1 mg total) by mouth at bedtime.  90 tablet  3  . sildenafil (VIAGRA) 100 MG tablet Take 100 mg by mouth daily as needed.        . Tamsulosin HCl (FLOMAX) 0.4 MG CAPS Take 1 capsule (0.4 mg total) by mouth at bedtime.  90 capsule  3  . valsartan-hydrochlorothiazide (DIOVAN-HCT) 160-25 MG per tablet Take 1 tablet by mouth daily.  90 tablet  3  . DISCONTD: azithromycin (ZITHROMAX Z-PAK) 250 MG tablet Take 2 by mouth on day 1 and then 1 by mouth daily until gone  6 each  0  . DISCONTD: methylPREDNISolone (MEDROL DOSEPAK) 4 MG tablet follow package directions  21 tablet  0    No Known Allergies   Current Medications, Allergies, Past Medical History, Past Surgical History, Family History, and Social History were reviewed in [employer] record.    Review of Systems       The patient complains of some arthritis.  The patient denies fever, chills, sweats, anorexia, fatigue, weakness, malaise, weight loss, sleep disorder, blurring, diplopia, eye irritation, eye discharge, vision loss, eye pain, photophobia, earache, ear discharge, tinnitus, decreased hearing, nasal congestion, nosebleeds, sore throat, hoarseness,  chest pain, syncope, dyspnea on exertion, orthopnea, PND, peripheral edema, cough, dyspnea at rest, excessive sputum, hemoptysis, wheezing, pleurisy, nausea, vomiting, diarrhea, constipation, change in bowel habits, abdominal pain, melena, hematochezia, jaundice, gas/bloating, indigestion/heartburn, dysphagia, odynophagia, dysuria, hematuria, urinary frequency, urinary hesitancy, nocturia, incontinence, back pain, joint pain, joint swelling, muscle cramps, muscle weakness, stiffness, sciatica, restless legs, leg pain at night, leg pain with exertion, rash, itching, dryness, suspicious lesions, paralysis, paresthesias, seizures, tremors, vertigo, transient blindness, frequent falls, frequent headaches, difficulty walking, depression, anxiety, memory loss, confusion, cold intolerance, heat intolerance, polydipsia, polyphagia, polyuria, unusual weight change, abnormal bruising, bleeding, enlarged lymph nodes, urticaria, allergic rash, hay fever, and recurrent infections.   Objective:   Physical Exam     WD, Overweight, 61 y/o BM in NAD... Vital Signs:  Reviewed w/ pt... GENERAL:  Alert & oriented; pleasant & cooperative... HEENT:  [place]/AT, EOM-wnl, PERRLA, EACs- +cerumen, TMs-wnl, NOSE-clear, THROAT-clear & wnl. NECK:  Supple w/ full ROM; no JVD; normal carotid impulses w/o bruits; no thyromegaly or nodules palpated; no lymphadenopathy. CHEST:  Clear to P & A; without wheezes/ rales/ or rhonchi heard... HEART:  Regular Rhythm; without murmurs/ rubs/ or gallops detected... ABDOMEN:  Soft & nontender; normal bowel sounds; no organomegaly or masses palpated... RECTAL:  Sl boggy & tender prostate... EXT: without deformities or arthritic changes; no varicose veins/ venous insuffic/ or edema. NEURO:  CN's intact; motor testing normal; sensory testing normal; gait normal & balance OK DERM:  No lesions noted; no rash etc...  RADIOLOGY DATA:  Reviewed in the EPIC EMR & discussed w/ the patient...  LABORATORY  DATA:  Reviewed in the EPIC EMR & discussed w/ the patient...   Assessment & Plan:    OSA>  Sleep study was pos for OSA w/ AHI=69 & desat to 72%; started on CPAP via full face mask titrated to CPAP=17...  HBP>  Controlled on Diovan/HCT;  Continue same meds, needs better diet, get wt down!  CHOL>  Lipids are improved but not at goals on Livalo 1mg Qhs; rec same med, better diet & must get wt down or incr med next time...  DM>  FBS now 122 & A1c=7.2 (on MetformER500Bid);  He hasn't been able to effectively diet or exercise;  He needs to lose a substantial amt of weight & we discussed 10-15 lb increments; in the meanwhile> continue same med...  UTI, BPH, Hx elev PSA>  Prev UTI symptoms & PROSTATITIS (EColi, pan-sens & ok to treat w/ SeptraDS vs Cipro) resolved & current UA C&S is neg==> resolved;  We will also give a trial of FLOMAX 0.4mg /d==> improved.  Other medical problems as noted...   Patient's Medications  New Prescriptions   AZITHROMYCIN (ZITHROMAX) 250 MG TABLET    Take as directed  Previous Medications   CETIRIZINE (ZYRTEC) 10 MG TABLET    Take 10 mg by mouth daily as needed.    CLOTRIMAZOLE-BETAMETHASONE (LOTRISONE) CREAM    Apply as directed   ETODOLAC (LODINE) 400 MG TABLET    Take 1 tablet (400 mg total) by mouth 2 (two) times daily. As needed for arthritis pain   GLUCOSE BLOOD TEST STRIP    1 each by Other route as needed. Use as instructed    LANCETS (FREESTYLE) LANCETS    Use as instructed   METFORMIN (GLUCOPHAGE) 500 MG TABLET    Take 1 tablet (500 mg total) by mouth 2 (two) times daily with a meal.   MULTIVITAMIN (THERAGRAN) PER TABLET    Take 1 tablet by mouth daily.     PITAVASTATIN CALCIUM 1 MG TABS    Take 1 tablet (1 mg total) by mouth at bedtime.   SILDENAFIL (VIAGRA) 100 MG TABLET    Take 100 mg by mouth daily as needed.     TAMSULOSIN HCL (FLOMAX) 0.4 MG CAPS    Take 1 capsule (0.4 mg total) by mouth at bedtime.   VALSARTAN-HYDROCHLOROTHIAZIDE (DIOVAN-HCT)  160-25 MG PER TABLET    Take 1 tablet by mouth daily.  Modified Medications   No medications on file  Discontinued Medications   AZITHROMYCIN (ZITHROMAX Z-PAK) 250 MG TABLET    Take 2 by mouth on day 1 and then 1 by mouth daily until gone   METHYLPREDNISOLONE (MEDROL DOSEPAK) 4 MG TABLET    follow package directions

## 2012-05-14 NOTE — Patient Instructions (Addendum)
Today we updated your med list in our EPIC system...    Continue your current medications the same...  Today we did your Diabetic labs & we will call you w/ the results...  Keep up the good work w/ diet & exercise...    The goal is to lose 10-15 lns...  Call for any problems...  Let's plan a follow up visit w/ FASTING blood work in about 6 months.Marland KitchenMarland Kitchen

## 2012-05-20 ENCOUNTER — Ambulatory Visit: Payer: Federal, State, Local not specified - PPO | Admitting: Pulmonary Disease

## 2012-05-28 ENCOUNTER — Ambulatory Visit: Payer: Federal, State, Local not specified - PPO | Admitting: Pulmonary Disease

## 2012-06-03 ENCOUNTER — Ambulatory Visit (INDEPENDENT_AMBULATORY_CARE_PROVIDER_SITE_OTHER): Payer: Federal, State, Local not specified - PPO | Admitting: Pulmonary Disease

## 2012-06-03 ENCOUNTER — Encounter: Payer: Self-pay | Admitting: Pulmonary Disease

## 2012-06-03 VITALS — BP 118/72 | HR 85 | Temp 98.0°F | Ht 71.5 in | Wt 230.8 lb

## 2012-06-03 DIAGNOSIS — G4733 Obstructive sleep apnea (adult) (pediatric): Secondary | ICD-10-CM

## 2012-06-03 NOTE — Patient Instructions (Addendum)
Will leave cpap at 12 for now, but let me know if this is not helping you. Continue working on weight loss Keep up with cushion exchanges and supplies followup with me in one year.

## 2012-06-03 NOTE — Progress Notes (Signed)
  Subjective:    Patient ID: Levi Lee, male    DOB: [birth year], 61 y.o.   MRN: [id number]  HPI The patient comes in today for followup of his obstructive sleep apnea.  He has been wearing CPAP compliantly, but has not been able to tolerate his optimal pressure.  I have tried him on the automatic setting, and he continued to have issues.  We decided to leave him on 12 cm to see how she would respond.  He feels that he sleeps well, and that his alertness is adequate during the day.  He will have some daytime sleepiness in the late afternoons at times, but does not feel that it affects his quality of life.  No one has commented on breakthrough snoring while wearing CPAP.   Review of Systems  Constitutional: Negative for fever and unexpected weight change.  HENT: Negative for ear pain, nosebleeds, congestion, sore throat, rhinorrhea, sneezing, trouble swallowing, dental problem, postnasal drip and sinus pressure.   Eyes: Negative for redness and itching.  Respiratory: Negative for cough, chest tightness, shortness of breath and wheezing.   Cardiovascular: Negative for palpitations and leg swelling.  Gastrointestinal: Negative for nausea and vomiting.  Genitourinary: Negative for dysuria.  Musculoskeletal: Negative for joint swelling.  Skin: Negative for rash.  Neurological: Negative for headaches.  Hematological: Does not bruise/bleed easily.  Psychiatric/Behavioral: Negative for dysphoric mood. The patient is not nervous/anxious.        Objective:   Physical Exam Well-developed male in no acute distress No skin breakdown or pressure necrosis from the CPAP mask Lower extremities without edema, no cyanosis Alert and oriented, moves all 4 extremities.       Assessment & Plan:

## 2012-06-03 NOTE — Assessment & Plan Note (Signed)
The patient is satisfied with his symptom control on his current setting of 12 cm.  He would like to keep his pressure here, and continue working on weight loss.  He has lost 7 pounds since his last visit.  I have asked him to keep up with his supplies and mask changes, and to followup with me in one year.

## 2012-07-01 ENCOUNTER — Other Ambulatory Visit: Payer: Self-pay | Admitting: Pulmonary Disease

## 2012-07-01 ENCOUNTER — Telehealth: Payer: Self-pay | Admitting: Pulmonary Disease

## 2012-07-01 MED ORDER — VALSARTAN-HYDROCHLOROTHIAZIDE 160-25 MG PO TABS: 1.0000 | ORAL_TABLET | Freq: Every day | ORAL | Status: DC

## 2012-07-01 MED ORDER — METFORMIN HCL 500 MG PO TABS: 500.0000 mg | ORAL_TABLET | Freq: Two times a day (BID) | ORAL | Status: DC

## 2012-07-01 MED ORDER — PITAVASTATIN CALCIUM 1 MG PO TABS: 1.0000 | ORAL_TABLET | Freq: Every day | ORAL | Status: DC

## 2012-07-01 NOTE — Telephone Encounter (Signed)
Refills sent. Pt is aware. Jennifer Castillo, CMA  

## 2012-07-07 ENCOUNTER — Other Ambulatory Visit: Payer: Self-pay | Admitting: Pulmonary Disease

## 2012-07-15 ENCOUNTER — Telehealth: Payer: Self-pay | Admitting: Pulmonary Disease

## 2012-07-15 NOTE — Telephone Encounter (Signed)
atc na wcb 

## 2012-07-16 MED ORDER — GLUCOSE BLOOD VI STRP: ORAL_STRIP | Status: DC

## 2012-07-16 MED ORDER — ETODOLAC 400 MG PO TABS: 400.0000 mg | ORAL_TABLET | Freq: Two times a day (BID) | ORAL | Status: DC

## 2012-07-16 NOTE — Telephone Encounter (Signed)
Refills sent to [facility]. Nothing further needed.Carron Curie, CMA'

## 2012-07-27 ENCOUNTER — Other Ambulatory Visit: Payer: Self-pay | Admitting: Pulmonary Disease

## 2012-07-27 MED ORDER — ONETOUCH LANCETS MISC: Status: AC

## 2012-09-14 ENCOUNTER — Telehealth: Payer: Self-pay | Admitting: Pulmonary Disease

## 2012-09-14 MED ORDER — PITAVASTATIN CALCIUM 1 MG PO TABS: 1.0000 | ORAL_TABLET | Freq: Every day | ORAL | Status: DC

## 2012-09-14 NOTE — Telephone Encounter (Signed)
Refill sent. Pt is aware. Jennifer Castillo, CMA  

## 2012-10-28 ENCOUNTER — Encounter: Payer: Self-pay | Admitting: *Deleted

## 2012-11-12 ENCOUNTER — Ambulatory Visit: Payer: Federal, State, Local not specified - PPO | Admitting: Pulmonary Disease

## 2012-11-19 ENCOUNTER — Ambulatory Visit: Payer: Federal, State, Local not specified - PPO | Admitting: Pulmonary Disease

## 2012-12-15 ENCOUNTER — Telehealth: Payer: Self-pay | Admitting: Pulmonary Disease

## 2012-12-15 DIAGNOSIS — I10 Essential (primary) hypertension: Secondary | ICD-10-CM

## 2012-12-15 DIAGNOSIS — Z0000 Encounter for general adult medical examination without abnormal findings: Secondary | ICD-10-CM

## 2012-12-15 DIAGNOSIS — Z862 Personal history of diseases of the blood and blood-forming organs and certain disorders involving the immune mechanism: Secondary | ICD-10-CM

## 2012-12-15 DIAGNOSIS — Z87898 Personal history of other specified conditions: Secondary | ICD-10-CM

## 2012-12-15 DIAGNOSIS — E119 Type 2 diabetes mellitus without complications: Secondary | ICD-10-CM

## 2012-12-15 DIAGNOSIS — K5730 Diverticulosis of large intestine without perforation or abscess without bleeding: Secondary | ICD-10-CM

## 2012-12-15 DIAGNOSIS — E7800 Pure hypercholesterolemia, unspecified: Secondary | ICD-10-CM

## 2012-12-15 NOTE — Telephone Encounter (Signed)
Called spoke with patient's spouse Pt has cpx scheduled for 6.25.14 and would like to come in prior to ov for fasting labs Will discuss with Leigh on what labs to place Pt to come in the week of 6.16.14 for labs

## 2012-12-16 NOTE — Telephone Encounter (Signed)
Lab orders placed for cbcd, bmet, a1c, lipid, tsh, hepatic, psa, ua.  Pt has had testerone level check 07/2011 and 10/2011 with both results low normal, no rx given.  Leigh, please advise if a testosterone level needs to be added to the upcoming labs. Thanks!

## 2013-01-13 ENCOUNTER — Other Ambulatory Visit (INDEPENDENT_AMBULATORY_CARE_PROVIDER_SITE_OTHER): Payer: Federal, State, Local not specified - PPO

## 2013-01-13 DIAGNOSIS — I10 Essential (primary) hypertension: Secondary | ICD-10-CM

## 2013-01-13 DIAGNOSIS — Z8639 Personal history of other endocrine, nutritional and metabolic disease: Secondary | ICD-10-CM

## 2013-01-13 DIAGNOSIS — Z0000 Encounter for general adult medical examination without abnormal findings: Secondary | ICD-10-CM

## 2013-01-13 DIAGNOSIS — Z862 Personal history of diseases of the blood and blood-forming organs and certain disorders involving the immune mechanism: Secondary | ICD-10-CM

## 2013-01-13 DIAGNOSIS — K5730 Diverticulosis of large intestine without perforation or abscess without bleeding: Secondary | ICD-10-CM

## 2013-01-13 DIAGNOSIS — Z87898 Personal history of other specified conditions: Secondary | ICD-10-CM

## 2013-01-13 DIAGNOSIS — E7800 Pure hypercholesterolemia, unspecified: Secondary | ICD-10-CM

## 2013-01-13 DIAGNOSIS — E119 Type 2 diabetes mellitus without complications: Secondary | ICD-10-CM

## 2013-01-13 LAB — URINALYSIS, ROUTINE W REFLEX MICROSCOPIC
Bilirubin Urine: NEGATIVE
Hgb urine dipstick: NEGATIVE
Leukocytes, UA: NEGATIVE
Nitrite: NEGATIVE
Total Protein, Urine: NEGATIVE
Urobilinogen, UA: 0.2 (ref 0.0–1.0)
WBC, UA: NONE SEEN (ref 0–?)

## 2013-01-13 LAB — LDL CHOLESTEROL, DIRECT: Direct LDL: 114.8 mg/dL

## 2013-01-13 LAB — CBC WITH DIFFERENTIAL/PLATELET
Basophils Absolute: 0 10*3/uL (ref 0.0–0.1)
Eosinophils Absolute: 0.1 10*3/uL (ref 0.0–0.7)
Eosinophils Relative: 2 % (ref 0.0–5.0)
HCT: 42.7 % (ref 39.0–52.0)
Lymphs Abs: 2.2 10*3/uL (ref 0.7–4.0)
MCHC: 34.8 g/dL (ref 30.0–36.0)
MCV: 97.6 fl (ref 78.0–100.0)
Monocytes Absolute: 0.4 10*3/uL (ref 0.1–1.0)
Neutrophils Relative %: 40.7 % — ABNORMAL LOW (ref 43.0–77.0)
Platelets: 152 10*3/uL (ref 150.0–400.0)
RDW: 13.3 % (ref 11.5–14.6)
WBC: 4.6 10*3/uL (ref 4.5–10.5)

## 2013-01-13 LAB — HEPATIC FUNCTION PANEL
ALT: 41 U/L (ref 0–53)
Bilirubin, Direct: 0.1 mg/dL (ref 0.0–0.3)
Total Protein: 7.5 g/dL (ref 6.0–8.3)

## 2013-01-13 LAB — PSA: PSA: 0.53 ng/mL (ref 0.10–4.00)

## 2013-01-13 LAB — BASIC METABOLIC PANEL
CO2: 27 mEq/L (ref 19–32)
Calcium: 9.3 mg/dL (ref 8.4–10.5)
Sodium: 137 mEq/L (ref 135–145)

## 2013-01-13 LAB — LIPID PANEL
Cholesterol: 195 mg/dL (ref 0–200)
Total CHOL/HDL Ratio: 5
Triglycerides: 261 mg/dL — ABNORMAL HIGH (ref 0.0–149.0)

## 2013-01-13 LAB — HEMOGLOBIN A1C: Hgb A1c MFr Bld: 6.9 % — ABNORMAL HIGH (ref 4.6–6.5)

## 2013-01-13 LAB — TESTOSTERONE: Testosterone: 281.67 ng/dL — ABNORMAL LOW (ref 350.00–890.00)

## 2013-01-20 ENCOUNTER — Ambulatory Visit (INDEPENDENT_AMBULATORY_CARE_PROVIDER_SITE_OTHER): Payer: Federal, State, Local not specified - PPO | Admitting: Pulmonary Disease

## 2013-01-20 ENCOUNTER — Encounter: Payer: Self-pay | Admitting: Pulmonary Disease

## 2013-01-20 VITALS — BP 126/88 | HR 60 | Temp 97.8°F | Ht 71.0 in | Wt 234.4 lb

## 2013-01-20 DIAGNOSIS — M1990 Unspecified osteoarthritis, unspecified site: Secondary | ICD-10-CM

## 2013-01-20 DIAGNOSIS — G4733 Obstructive sleep apnea (adult) (pediatric): Secondary | ICD-10-CM

## 2013-01-20 DIAGNOSIS — E119 Type 2 diabetes mellitus without complications: Secondary | ICD-10-CM

## 2013-01-20 DIAGNOSIS — I10 Essential (primary) hypertension: Secondary | ICD-10-CM

## 2013-01-20 DIAGNOSIS — E663 Overweight: Secondary | ICD-10-CM

## 2013-01-20 DIAGNOSIS — E7800 Pure hypercholesterolemia, unspecified: Secondary | ICD-10-CM

## 2013-01-20 DIAGNOSIS — Z0000 Encounter for general adult medical examination without abnormal findings: Secondary | ICD-10-CM

## 2013-01-20 MED ORDER — ETODOLAC 400 MG PO TABS: 400.0000 mg | ORAL_TABLET | Freq: Two times a day (BID) | ORAL | Status: DC

## 2013-01-20 MED ORDER — METFORMIN HCL 500 MG PO TABS: 500.0000 mg | ORAL_TABLET | Freq: Two times a day (BID) | ORAL | Status: DC

## 2013-01-20 MED ORDER — TAMSULOSIN HCL 0.4 MG PO CAPS: 0.4000 mg | ORAL_CAPSULE | Freq: Every day | ORAL | Status: DC

## 2013-01-20 MED ORDER — PITAVASTATIN CALCIUM 1 MG PO TABS: 1.0000 | ORAL_TABLET | Freq: Every day | ORAL | Status: DC

## 2013-01-20 MED ORDER — VALSARTAN-HYDROCHLOROTHIAZIDE 160-25 MG PO TABS: 1.0000 | ORAL_TABLET | Freq: Every day | ORAL | Status: DC

## 2013-01-20 NOTE — Patient Instructions (Addendum)
Today we updated your med list in our EPIC system...    Continue your current medications the same...  We reviewed your recent fasting blood work & gave you a copy for your records...   Let's get on track w/ our diet & exercise program...  Call for any questions...  Let's plan a follow up visit in 31mo, sooner if needed for problems.Marland KitchenMarland Kitchen

## 2013-01-23 ENCOUNTER — Encounter: Payer: Self-pay | Admitting: Pulmonary Disease

## 2013-01-23 NOTE — Progress Notes (Signed)
Subjective:    Patient ID: Levi Lee, male    DOB: 1951-03-11, 62 y.o.   MRN: [id number]  HPI 62 y/o BM here for a folllow up visit... He has mult medical problems as noted below> he has a sister who is a primary care doctor in [place] & a son-in-law who is a nephrologist in [facility]...  ~  August 13, 2011:  40mo ROV & Levi Lee is c/o sinusitis w/ yellow drainage & wants a ZPak, he says;  BP controlled on meds & he denies CP, palpit, dizzy, SOB, or edema;  See problem list below>>    >Wife is c/o his snoring & sleep apnea which she feels is getting worse; now he endorses some daytime hypersomnolence & sleep pressure; I told him to avoid driving any distances & sched Sleep Study ASAP; we reviewed diet & exercise program- wt reduction is paramount & he understands...    >He was also intol to the Crestor20 stating that it caused cramping all over & he stopped it after several months (never even got f/u FLP on the med); we called in the ultra-low-dose Livalo 1mg  & he has been tolerating this; FLP today showed TChol 222, TG 106, HDL 39, LDL 156...    >His wt is up 2# further to 241# & not really on diet/ exercising/ etc; FBS at home averages 120-130 he says; Labs today> FBS= 171, A1c= 7.4 and we will recommend increasing his METFORMIN to 500mg  Bid...    >He also notes that his energy is low, ?lost his drive etc; he is on [id number] & voiding prev symptoms are improved; we discussed checking for "Low-T" (Testos level = 350 (norm 350-900)...    >He tells me he fell w/ rotator cuff injury per DrWainer (we don't have notes from him); no surg yet, he had phys therapy & improved... LABS 1/13:  FLP- [id number] on diet;  Chems- ok x BS171 A1c7.4 on Metform500/d;  CBC- ok;  TSH=2.25;  PSA=1.55  ~  November 12, 2011:  11mo ROV & here for CPX> Levi Lee states that he is doing well, feels good w/o new complaints or concerns;  He saw DrClance 2/13 for Sleep eval> OSA w/ sleep study (split night study) showing AHI=69 & titrated  on CPAP to optimal pressure of 17;  He set up CPAP w/ mod pressure level for desensitization & will titrate it later; pt asked to work on weight reduction...  We reviewed prob list, meds, XRays & Labs> see prob list below>> CXR 4/13 showed normal heart size, clear lungs, NAD.Marland KitchenMarland Kitchen EKG 4/13 showed NSR, rate68, WNL.Marland KitchenMarland Kitchen LABS 4/13:  FLP- [id number] on Liv1mg ;  Chems- ok x BS144 A1c6.7 on Metform;  Testos=331  ~  May 14, 2012:  1mo ROV & Levi Lee is c/o his allergies acting up this fall- using OTC antihist as needed; also wants ZPak to keep on hand for the winter...    HBP is well controlled on DiovanHCT;  He has tolerated the Livalo1mg  for his chol (didn't tol Crestor) & labs improved but not at goals, not fasting today for FLP & we stressed need for diet & wt loss;  DM fair on Metform monotherapy> BS=122, A1c=7.2> need to get wt down!...  Other med problems as noted below...    We reviewed prob list, meds, xrays and labs> see below for updates >> he declines the Flu vaccine.  ~  January 20, 2013:  4mo ROV & CPX> he is c/o some recent big toe pain, no known gout  attacks, taking his Lodine w/ some relief...     Hx OSA> Sleep study 1/13 showed AHI=69, desat to 72%, eval by DrClance & desensitized to CPAP; tol well- resting well, no daytime issues, etc; still hasn't lost any weight...    HBP> on Diovan160, HCT25;  BP= 126/88 & he denies HA, visual sx, CP, palpit, SOB, edema, etc; he is an excellent golfer & played 27 holes recently w/o problems...    Hyperlipid> on Livalo1mg ;  FLP 6/14 shows TChol 195, TG 261, HDL 38, LDL 115; we reviewed diet, exercise, wt reduction needed...    DM> on Metform500Bid;  Lab 6/14 reveals BS= 149, A1c= 6.9 and he is reminded of low carb weight reducing diet...    Overweight> weight= 234#,  71" tall,  BMI= 32-3; we reviewed diet, exercise, wt reduction strategies...     GI- Divertics, colon polyps> last colonoscopy by DrStark 11/10 showed divertics, hems, tub adenoma & f/u planned 14yrs...      GU- BPH> on Flomax0.4, Viagra100; he had a PSA of 17 in 2003 w/ neg bx by DrNesi; labs 6/14 showed PSA= 0.53; Testos level= 282 but he denies energy problems, dysfunction, etc...     DJD> on Etodolac 400mg  bid as needed...  We reviewed prob list, meds, xrays and labs> see below for updates >>  LABS 6/14:  FLP- not at goals on Livalo1mg ;  Chems- ok x BS=149 A1c=6.9 on Metform500Bid;  CBC- wnl;  TSH=1.78;  PSA=0.53;  Testos=282...          Problem List:     Hx of OBSTRUCTIVE SLEEP APNEA (ICD-327.23) - eval in 1996 w/ sleep study showing RDI= 19, mod snoring, O2 sat to 90%... saw DrRedman for ENT due to snoring & tonsillar hypertrophy- offered UPPP surg but pt declined... since then he's gained weight, continued snoring & denied hypersom etc> not interested in further evaluation until wife c/o his snoring & restless sleeping;  Repeat seep study 1/13 revealed AHI=69 & desat to 72% w/ the obstructive events, split night protocol w/ CPAP titrate to 17cmH2O pressure... ~  4/12:  Pt states that wife does c/o his snoring but he states he rests well, wakes refreshed, no daytime symptoms... ~  1/13:  Repeat seep study 1/13 revealed AHI=69 & desat to 72% w/ the obstructive events, split night protocol w/ CPAP titrate to 17cmH2O pressure; He saw DrClance 2/13 & set up CPAP w/ mod pressure level for desensitization & will titrate it later; pt asked to work on weight reduction... ~  4/13: pt states that he is doing well on the CPAP; resting better, wakes refreshed, no further daytime hypersomnolence, sleep pressure, etc... ~  11/13: he had f/u DrClance> wearing CPAP compliantly but not able to tol optimal pressure; rests well, adeq daytime alertness, asked to work on wt reduction...  HYPERTENSION (ICD-401.9) - on DIOVAN/Hct 160-25 daily... ~  baseline EKG showed NSR, WNL.[name]. ~  baseline CXR showed clear, NAD; DJD spine. ~  1/13:  BP= 118/78 & feeling ok; denies A, CP, palpit, SOB, swelling, etc... ~  4/13:   BP= 138/80 & he remains largely asymptomatic... ~  10/13:  BP= 110/62 & he denies HA, CP, palpit, SOB, edema, etc... ~  6/14: on Diovan160, HCT25;  BP= 126/88 & he denies HA, visual sx, CP, palpit, SOB, edema, etc; he is an excellent golfer & played 27 holes recently w/o problems  HYPERCHOLESTEROLEMIA (ICD-272.0) - prev on Lip80, pt stopped on his own 1/12 due to cramping (resolved off  the med); NOTE: he had mild incr LFTs as well while on the Lip80 & these ret to normal off the med; He notes similar cramping all over from Crestor20; Now on LIVALO 1mg /d & tol ok so far... ~  FLP 7/09 on Lip80 showed TChol 165, TG 165, HDL 37, LDL 95... same med, better diet, lose wt. ~  FLP 6/10 on Lip80 showed TChol 148, TG 74, HDL 44, LDL 90 ~  FLP 6/11 on Lip80 showed TChol 180, TG 148, HDL 43, LDL 108... needs to get wt down! ~  FLP 4/12 on diet alone showed TChol 257, TG 202, HDL41, LDL176... rec to start Crestor 20mg /d. ~  9/12 OV ==> not fasting for FLP today & he stopped Cres20 after 12mo due to cramping all over; rec trial LIVALO 1mg . ~  FLP 1/13 on Livalo1mg  showed TChol 222, TG 106, HDL 39, LDL 156... Improved but not at goal, continue same med & get wt down! ~  FLP 4/13 on Liv1mg  showed TChol 191, TG 114, HDL 41, LDL 127... imoroved further, continue same, better diet, get wt down! ~  FLP 6/14 on Liv1mg  showed TChol 195, TG 261, HDL 38, LDL 115  DIABETES MELLITUS >> hx elev FBS in past> diet Rx; started on METFORMIN ER 500mg Qam 4/12 w/ A1c up to 7.3.Marland KitchenMarland Kitchen ~  labs 6/10 showed BS= 111, A1c= 6.4... On diet alone. ~  labs 6/11 showed BS= 148, A1c= 6.6.Marland KitchenMarland Kitchen may need meds, get weight down. ~  labs 4/12 on diet alone showed BS= 132, A1c= 7.3.Marland KitchenMarland Kitchen rec to start MetforminER 500mg Qam. ~  Labs 9/12 on MetformER500/d showed BS=151, A1c=7.1.Marland KitchenMarland Kitchen Continue same... ~  Labs 1/13 on MetformER500/d showed BS=171, A1c=7.4.Marland KitchenMarland Kitchen rec to incr Metformin to 500mg  Bid. ~  Labs 4/13 on Metform500Bid showed BS= 144, A1c= 6.7.Marland KitchenMarland Kitchen Continue same,  GET WT DOWN! ~  Labs 10/13 on Metform500Bid showed BS= 122, A1c= 7.2... Again we stress wt reduction... ~  Labs 6/14 on Metform500Bid showed BS= 149, A1c= 6.9  OVERWEIGHT (ICD-278.02) - as above: we discussed diet, exercise, get weight down... ~  weight 7/09 = 247# ~  weight 6/10 = 241# ~  weight 6/11 = 240# ~  Weight 4/12 = 244# ~  Weight 9/12 = 239# ~  Weight 1/13 = 241# ~  Weight 4/13 = 236# ~  Weight 10/13 = 231# ~  Weight 6/14 = 234#  DIVERTICULOSIS OF COLON (ICD-562.10) & COLONIC POLYPS (ICD-211.3) ~  colonoscopy 7/07 by DrStark showed 7mm polyp= tubular adenoma... ~  colonoscopy 11/10 showed divertics, polyp= tub adenoma, +hems... f/u planned 26yrs.  LIVER FUNCTION TESTS, ABNORMAL, HX OF (ICD-V12.2) - could be Statin related vs Steatosis... ~  labs 6/10 showed AlkPhos=71, SGOT=47, SGPT=65 ~  labs 6/11 showed AlkPhos=61, SGOT=39, SGPT=57 ~  Labs 4/12 off statin Rx showed SGOT=30, SGPT=50 ~  Labs 1/13 on Livalo1 showed SGOT=38, SGPT=63 ~  Labs 4/13 on Livalo1 showed SGOT= 27, SGPT= 35... WNL on low dose Livalo. ~  Labs 6/14 on Liv1 showed SGOT= 30, SGPT= 41  BENIGN PROSTATIC HYPERTROPHY, HX OF (ICD-V13.8) - hx of elevated PSA w/ neg prostate bx's from [facility] in 2003 (PSA was 17)... ~  PSA 5/08 was 0.66... ~  PSA 7/09 is 0.52... ~  Eval 6/10 DrNesi: doing well, PSA reported 0.90 ~  labs 6/11 showed PSA= 0.59 ~  Labs 4/12 showed PSA= 0.68 ~  Labs 1/13 showed PSA= 1.55 & Testos level = 350 ~  Labs 4/13 showed Testos level = 331 &  pt reports good energy, feeling well. ~  Labs 6/14 showed Testos level = 282  DEGENERATIVE JOINT DISEASE (ICD-715.90) - he uses ETODOLAC as needed.  Health Maintenance>  ~  GI:  Followed byDrStark w/ hx adenomatous polyps on colon check 2007 & 2010> f/u due 11/15 ~  GU:  Followed by Thermon Leyland w/ hx elev PSA & neg bx 2003; yearly PSAs all <1 since then & PSA 1/13= 1.55 ~  Immuniz:  He had Pneumovax in 2000 at age 75, repeat due when he turns 63;He  refuses the seasonal FLU vaccine; Due for TDAP but wants to wait.   Past Surgical History  Procedure Laterality Date  . Vasectomy      by Dr. Brunilda Payor    Outpatient Encounter Prescriptions as of 01/20/2013  Medication Sig Dispense Refill  . cetirizine (ZYRTEC) 10 MG tablet Take 10 mg by mouth daily as needed.       . clotrimazole-betamethasone (LOTRISONE) cream Apply as directed  15 g  1  . etodolac (LODINE) 400 MG tablet Take 1 tablet (400 mg total) by mouth 2 (two) times daily. As needed for arthritis pain  180 tablet  3  . glucose blood test strip Test blood once a day as directed. For One Touch Device.  100 each  3  . Lancets (FREESTYLE) lancets Use as instructed  100 each  0  . metFORMIN (GLUCOPHAGE) 500 MG tablet Take 1 tablet (500 mg total) by mouth 2 (two) times daily with a meal.  180 tablet  3  . multivitamin (THERAGRAN) per tablet Take 1 tablet by mouth daily.        . ONE TOUCH LANCETS MISC Use as directed for blood glucose monitoring  600 each  3  . Pitavastatin Calcium 1 MG TABS Take 1 tablet (1 mg total) by mouth at bedtime.  30 tablet  11  . sildenafil (VIAGRA) 100 MG tablet Take 100 mg by mouth daily as needed.        . tamsulosin (FLOMAX) 0.4 MG CAPS Take 1 capsule (0.4 mg total) by mouth at bedtime.  30 capsule  11  . valsartan-hydrochlorothiazide (DIOVAN-HCT) 160-25 MG per tablet Take 1 tablet by mouth daily.  90 tablet  3  . [DISCONTINUED] etodolac (LODINE) 400 MG tablet Take 1 tablet (400 mg total) by mouth 2 (two) times daily. As needed for arthritis pain  180 tablet  3  . [DISCONTINUED] metFORMIN (GLUCOPHAGE) 500 MG tablet Take 1 tablet (500 mg total) by mouth 2 (two) times daily with a meal.  180 tablet  3  . [DISCONTINUED] Pitavastatin Calcium 1 MG TABS Take 1 tablet (1 mg total) by mouth at bedtime.  90 tablet  3  . [DISCONTINUED] Tamsulosin HCl (FLOMAX) 0.4 MG CAPS Take 1 capsule (0.4 mg total) by mouth at bedtime.  90 capsule  3  . [DISCONTINUED]  valsartan-hydrochlorothiazide (DIOVAN-HCT) 160-25 MG per tablet Take 1 tablet by mouth daily.  90 tablet  3  . [DISCONTINUED] azithromycin (ZITHROMAX) 250 MG tablet Take as directed  6 each  2   No facility-administered encounter medications on file as of 01/20/2013.    No Known Allergies   Current Medications, Allergies, Past Medical History, Past Surgical History, Family History, and Social History were reviewed in [employer] record.    Review of Systems       The patient complains of some arthritis.  The patient denies fever, chills, sweats, anorexia, fatigue, weakness, malaise, weight loss, sleep disorder, blurring,  diplopia, eye irritation, eye discharge, vision loss, eye pain, photophobia, earache, ear discharge, tinnitus, decreased hearing, nasal congestion, nosebleeds, sore throat, hoarseness, chest pain, syncope, dyspnea on exertion, orthopnea, PND, peripheral edema, cough, dyspnea at rest, excessive sputum, hemoptysis, wheezing, pleurisy, nausea, vomiting, diarrhea, constipation, change in bowel habits, abdominal pain, melena, hematochezia, jaundice, gas/bloating, indigestion/heartburn, dysphagia, odynophagia, dysuria, hematuria, urinary frequency, urinary hesitancy, nocturia, incontinence, back pain, joint pain, joint swelling, muscle cramps, muscle weakness, stiffness, sciatica, restless legs, leg pain at night, leg pain with exertion, rash, itching, dryness, suspicious lesions, paralysis, paresthesias, seizures, tremors, vertigo, transient blindness, frequent falls, frequent headaches, difficulty walking, depression, anxiety, memory loss, confusion, cold intolerance, heat intolerance, polydipsia, polyphagia, polyuria, unusual weight change, abnormal bruising, bleeding, enlarged lymph nodes, urticaria, allergic rash, hay fever, and recurrent infections.   Objective:   Physical Exam     WD, Overweight, 61 y/o BM in NAD... Vital Signs:  Reviewed w/  pt... GENERAL:  Alert & oriented; pleasant & cooperative... HEENT:  [place]/AT, EOM-wnl, PERRLA, EACs- +cerumen, TMs-wnl, NOSE-clear, THROAT-clear & wnl. NECK:  Supple w/ full ROM; no JVD; normal carotid impulses w/o bruits; no thyromegaly or nodules palpated; no lymphadenopathy. CHEST:  Clear to P & A; without wheezes/ rales/ or rhonchi heard... HEART:  Regular Rhythm; without murmurs/ rubs/ or gallops detected... ABDOMEN:  Soft & nontender; normal bowel sounds; no organomegaly or masses palpated... RECTAL:  Sl boggy & tender prostate... EXT: without deformities or arthritic changes; no varicose veins/ venous insuffic/ or edema. NEURO:  CN's intact; motor testing normal; sensory testing normal; gait normal & balance OK DERM:  No lesions noted; no rash etc...  RADIOLOGY DATA:  Reviewed in the EPIC EMR & discussed w/ the patient...  LABORATORY DATA:  Reviewed in the EPIC EMR & discussed w/ the patient...   Assessment & Plan:    OSA>  Sleep study was pos for OSA w/ AHI=69 & desat to 72%; started on CPAP via full face mask titrated to CPAP=17 which he trouble tolerating- followed by DrClance...  HBP>  Controlled on Diovan/HCT;  Continue same meds, needs better diet, get wt down!  CHOL>  Lipids are improved but not at goals on Livalo 1mg Qhs; rec same med, better diet & must get wt down or incr med next time...  DM>  FBS now 149 & A1c=6.9 (on MetformER500Bid);  He hasn't been able to effectively diet or exercise;  He needs to lose a substantial amt of weight & we discussed 10-15 lb increments; in the meanwhile> continue same med...  UTI, BPH, Hx elev PSA>  Prev UTI symptoms & PROSTATITIS (EColi, pan-sens & ok to treat w/ SeptraDS vs Cipro) resolved & current UA C&S is neg==> resolved;  We will also give a trial of FLOMAX 0.4mg /d==> improved.  Other medical problems as noted...   Patient's Medications  New Prescriptions   No medications on file  Previous Medications   CETIRIZINE (ZYRTEC) 10  MG TABLET    Take 10 mg by mouth daily as needed.    CLOTRIMAZOLE-BETAMETHASONE (LOTRISONE) CREAM    Apply as directed   GLUCOSE BLOOD TEST STRIP    Test blood once a day as directed. For One Touch Device.   LANCETS (FREESTYLE) LANCETS    Use as instructed   MULTIVITAMIN (THERAGRAN) PER TABLET    Take 1 tablet by mouth daily.     ONE TOUCH LANCETS MISC    Use as directed for blood glucose monitoring   SILDENAFIL (VIAGRA) 100 MG TABLET  Take 100 mg by mouth daily as needed.    Modified Medications   Modified Medication Previous Medication   ETODOLAC (LODINE) 400 MG TABLET etodolac (LODINE) 400 MG tablet      Take 1 tablet (400 mg total) by mouth 2 (two) times daily. As needed for arthritis pain    Take 1 tablet (400 mg total) by mouth 2 (two) times daily. As needed for arthritis pain   METFORMIN (GLUCOPHAGE) 500 MG TABLET metFORMIN (GLUCOPHAGE) 500 MG tablet      Take 1 tablet (500 mg total) by mouth 2 (two) times daily with a meal.    Take 1 tablet (500 mg total) by mouth 2 (two) times daily with a meal.   PITAVASTATIN CALCIUM 1 MG TABS Pitavastatin Calcium 1 MG TABS      Take 1 tablet (1 mg total) by mouth at bedtime.    Take 1 tablet (1 mg total) by mouth at bedtime.   TAMSULOSIN (FLOMAX) 0.4 MG CAPS Tamsulosin HCl (FLOMAX) 0.4 MG CAPS      Take 1 capsule (0.4 mg total) by mouth at bedtime.    Take 1 capsule (0.4 mg total) by mouth at bedtime.   VALSARTAN-HYDROCHLOROTHIAZIDE (DIOVAN-HCT) 160-25 MG PER TABLET valsartan-hydrochlorothiazide (DIOVAN-HCT) 160-25 MG per tablet      Take 1 tablet by mouth daily.    Take 1 tablet by mouth daily.  Discontinued Medications   AZITHROMYCIN (ZITHROMAX) 250 MG TABLET    Take as directed

## 2013-02-08 ENCOUNTER — Telehealth: Payer: Self-pay | Admitting: Pulmonary Disease

## 2013-02-08 MED ORDER — PITAVASTATIN CALCIUM 1 MG PO TABS: 1.0000 | ORAL_TABLET | Freq: Every day | ORAL | Status: DC

## 2013-02-08 NOTE — Telephone Encounter (Signed)
I spoke with pt spouse. He needs refill on pitavastin 1 mg for 90 days. I advised spouse will send in RX. Nothing further was needed

## 2013-02-11 ENCOUNTER — Telehealth: Payer: Self-pay | Admitting: Pulmonary Disease

## 2013-02-11 MED ORDER — PITAVASTATIN CALCIUM 1 MG PO TABS: 1.0000 | ORAL_TABLET | Freq: Every day | ORAL | Status: DC

## 2013-02-11 NOTE — Telephone Encounter (Signed)
Pt spiuse aware RX sent to [facility]

## 2013-04-19 ENCOUNTER — Telehealth: Payer: Self-pay | Admitting: Pulmonary Disease

## 2013-04-19 MED ORDER — COLCHICINE 0.6 MG PO TABS: 0.6000 mg | ORAL_TABLET | Freq: Two times a day (BID) | ORAL | Status: DC

## 2013-04-19 MED ORDER — PREDNISONE 20 MG PO TABS: ORAL_TABLET | ORAL | Status: DC

## 2013-04-19 NOTE — Telephone Encounter (Signed)
Per SN: Colchicine 0.6 mg- 1 by mouth BID;  Prednisone 20 mg: 1 BID x 3 days, 1 daily x 3 days, 1/2 daily until gone #12  Spoke with patient he is aware of recs and that Rx has been sent to verified pharmacy. Nothing further needed at this time

## 2013-04-19 NOTE — Telephone Encounter (Signed)
Spoke with the pt and he states he has suffered from gout in the past, but this time his flare has lasted a lot longer then usual. He states he has been taking advil, which has helped in the past but is not helping this time. He is requesting an rx. He states he has used colchicine in the past. Please advise. Carron Curie, CMA No Known Allergies

## 2013-04-22 ENCOUNTER — Other Ambulatory Visit: Payer: Self-pay | Admitting: Pulmonary Disease

## 2013-05-10 ENCOUNTER — Telehealth: Payer: Self-pay | Admitting: Pulmonary Disease

## 2013-05-10 MED ORDER — GLUCOSE BLOOD VI STRP: ORAL_STRIP | Status: DC

## 2013-05-10 NOTE — Telephone Encounter (Signed)
I spoke with spouse. She is requesting 90 day supply of pt glucose testing strips not the lancets. I advised will send this for her. Nothing further needed

## 2013-05-20 ENCOUNTER — Telehealth: Payer: Self-pay | Admitting: Pulmonary Disease

## 2013-05-20 MED ORDER — GLUCOSE BLOOD VI STRP: ORAL_STRIP | Status: AC

## 2013-05-20 MED ORDER — TAMSULOSIN HCL 0.4 MG PO CAPS: 0.4000 mg | ORAL_CAPSULE | Freq: Every day | ORAL | Status: AC

## 2013-05-20 NOTE — Telephone Encounter (Signed)
Spouse aware RX has been sent. Nothing further needed 

## 2013-05-31 ENCOUNTER — Telehealth: Payer: Self-pay | Admitting: Pulmonary Disease

## 2013-05-31 NOTE — Telephone Encounter (Signed)
Pt advised that he needs to keep appt once a year at least to stay current and for insurance purposes. Carron Curie, CMA

## 2013-06-02 ENCOUNTER — Ambulatory Visit: Payer: Federal, State, Local not specified - PPO | Admitting: Pulmonary Disease

## 2013-07-07 ENCOUNTER — Encounter: Payer: Self-pay | Admitting: Pulmonary Disease

## 2013-07-07 ENCOUNTER — Ambulatory Visit (INDEPENDENT_AMBULATORY_CARE_PROVIDER_SITE_OTHER): Payer: Federal, State, Local not specified - PPO | Admitting: Pulmonary Disease

## 2013-07-07 VITALS — BP 116/82 | HR 65 | Temp 97.8°F | Ht 71.0 in | Wt 238.2 lb

## 2013-07-07 DIAGNOSIS — G4733 Obstructive sleep apnea (adult) (pediatric): Secondary | ICD-10-CM

## 2013-07-07 NOTE — Patient Instructions (Signed)
Continue on cpap, but call if you are not satisfied with your daytime alertness. Work  On weight loss Follow up with me in one year if doing well.

## 2013-07-07 NOTE — Progress Notes (Signed)
   Subjective:    Patient ID: Levi Lee, male    DOB: 12/12/1950, 62 y.o.   MRN: [id number]  HPI The patient comes in today for followup of his obstructive sleep apnea. He is wearing CPAP compliantly, and is having no issues with his mask fit or pressure. He feels that he is sleeping well, and denies any significant daytime alertness issues. He has been keeping up with his mask changes and supplies, and his weight is up 8 pounds since last visit.   Review of Systems  Constitutional: Negative for fever and unexpected weight change.  HENT: Negative for congestion, dental problem, ear pain, nosebleeds, postnasal drip, rhinorrhea, sinus pressure, sneezing, sore throat and trouble swallowing.   Eyes: Negative for redness and itching.  Respiratory: Negative for cough, chest tightness, shortness of breath and wheezing.   Cardiovascular: Negative for palpitations and leg swelling.  Gastrointestinal: Negative for nausea and vomiting.  Genitourinary: Negative for dysuria.  Musculoskeletal: Negative for joint swelling.  Skin: Negative for rash.  Neurological: Negative for headaches.  Hematological: Does not bruise/bleed easily.  Psychiatric/Behavioral: Negative for dysphoric mood. The patient is not nervous/anxious.        Objective:   Physical Exam Overweight male in no acute distress Nose without purulence or discharge noted No skin breakdown or pressure necrosis from the CPAP mask Neck without lymphadenopathy or thyromegaly Lower extremities without edema, no cyanosis Alert and oriented, moves all 4 extremities.       Assessment & Plan:

## 2013-07-07 NOTE — Assessment & Plan Note (Signed)
The patient feels that he is doing well from a sleep apnea standpoint. He is tolerating the device, and is satisfied with his sleep and daytime alertness. I have encouraged him to work aggressively on weight loss, and to followup with me in one year.

## 2013-07-12 ENCOUNTER — Other Ambulatory Visit (INDEPENDENT_AMBULATORY_CARE_PROVIDER_SITE_OTHER): Payer: Federal, State, Local not specified - PPO

## 2013-07-12 ENCOUNTER — Encounter: Payer: Self-pay | Admitting: Pulmonary Disease

## 2013-07-12 ENCOUNTER — Ambulatory Visit (INDEPENDENT_AMBULATORY_CARE_PROVIDER_SITE_OTHER): Payer: Federal, State, Local not specified - PPO | Admitting: Pulmonary Disease

## 2013-07-12 VITALS — BP 128/84 | HR 60 | Temp 97.1°F | Ht 71.0 in | Wt 239.0 lb

## 2013-07-12 DIAGNOSIS — E119 Type 2 diabetes mellitus without complications: Secondary | ICD-10-CM

## 2013-07-12 DIAGNOSIS — I10 Essential (primary) hypertension: Secondary | ICD-10-CM

## 2013-07-12 DIAGNOSIS — E7800 Pure hypercholesterolemia, unspecified: Secondary | ICD-10-CM

## 2013-07-12 DIAGNOSIS — M1990 Unspecified osteoarthritis, unspecified site: Secondary | ICD-10-CM

## 2013-07-12 DIAGNOSIS — E663 Overweight: Secondary | ICD-10-CM

## 2013-07-12 DIAGNOSIS — G4733 Obstructive sleep apnea (adult) (pediatric): Secondary | ICD-10-CM

## 2013-07-12 LAB — BASIC METABOLIC PANEL
BUN: 18 mg/dL (ref 6–23)
CO2: 28 mEq/L (ref 19–32)
Chloride: 100 mEq/L (ref 96–112)
Glucose, Bld: 134 mg/dL — ABNORMAL HIGH (ref 70–99)
Potassium: 4.7 mEq/L (ref 3.5–5.1)
Sodium: 137 mEq/L (ref 135–145)

## 2013-07-12 LAB — HEMOGLOBIN A1C: Hgb A1c MFr Bld: 7.4 % — ABNORMAL HIGH (ref 4.6–6.5)

## 2013-07-12 NOTE — Progress Notes (Signed)
Subjective:    Patient ID: Levi Lee, male    DOB: 01/18/1951, 62 y.o.   MRN: [id number]  HPI 62 y/o BM here for a folllow up visit... He has mult medical problems as noted below> he has a sister who is a primary care doctor in [place] & a son-in-law who is a nephrologist in [facility]...  ~  November 12, 2011:  69mo ROV & here for CPX> Levi Lee states that he is doing well, feels good w/o new complaints or concerns;  He saw DrClance 2/13 for Sleep eval> OSA w/ sleep study (split night study) showing AHI=69 & titrated on CPAP to optimal pressure of 17;  He set up CPAP w/ mod pressure level for desensitization & will titrate it later; pt asked to work on weight reduction...  We reviewed prob list, meds, XRays & Labs> see prob list below>> CXR 4/13 showed normal heart size, clear lungs, NAD.Marland KitchenMarland Kitchen EKG 4/13 showed NSR, rate68, WNL.Marland KitchenMarland Kitchen LABS 4/13:  FLP- [id number] on Liv1mg ;  Chems- ok x BS144 A1c6.7 on Metform;  Testos=331  ~  May 14, 2012:  77mo ROV & Levi Lee is c/o his allergies acting up this fall- using OTC antihist as needed; also wants ZPak to keep on hand for the winter...    HBP is well controlled on DiovanHCT;  He has tolerated the Livalo1mg  for his chol (didn't tol Crestor) & labs improved but not at goals, not fasting today for FLP & we stressed need for diet & wt loss;  DM fair on Metform monotherapy> BS=122, A1c=7.2> need to get wt down!...  Other med problems as noted below...    We reviewed prob list, meds, xrays and labs> see below for updates >> he declines the Flu vaccine.  ~  January 20, 2013:  51mo ROV & CPX> he is c/o some recent big toe pain, no known gout attacks, taking his Lodine w/ some relief...     Hx OSA> Sleep study 1/13 showed AHI=69, desat to 72%, eval by DrClance & desensitized to CPAP; tol well- resting well, no daytime issues, etc; still hasn't lost any weight...    HBP> on Diovan160, HCT25;  BP= 126/88 & he denies HA, visual sx, CP, palpit, SOB, edema, etc; he is an excellent golfer & played 27  holes recently w/o problems...    Hyperlipid> on Livalo1mg ;  FLP 6/14 shows TChol 195, TG 261, HDL 38, LDL 115; we reviewed diet, exercise, wt reduction needed...    DM> on Metform500Bid;  Lab 6/14 reveals BS= 149, A1c= 6.9 and he is reminded of low carb weight reducing diet...    Overweight> weight= 234#,  71" tall,  BMI= 32-3; we reviewed diet, exercise, wt reduction strategies...     GI- Divertics, colon polyps> last colonoscopy by DrStark 11/10 showed divertics, hems, tub adenoma & f/u planned 81yrs...     GU- BPH> on Flomax0.4, Viagra100; he had a PSA of 17 in 2003 w/ neg bx by DrNesi; labs 6/14 showed PSA= 0.53; Testos level= 282 but he denies energy problems, dysfunction, etc...     DJD> on Etodolac 400mg  bid as needed...  We reviewed prob list, meds, xrays and labs> see below for updates >>  LABS 6/14:  FLP- not at goals on Livalo1mg ;  Chems- ok x BS=149 A1c=6.9 on Metform500Bid;  CBC- wnl;  TSH=1.78;  PSA=0.53;  Testos=282...  ~  July 12, 2013:  77mo ROV & Levi Lee notes incr BS despite his Metformin but not really on diet & wt is up  another 5# to 239# (BMI=33); he refuses more meds and says he'll really try diet & exercise this time... We reviewed the following medical problems during today's office visit >>     Hx OSA> Sleep study 1/13 showed AHI=69, desat to 72%, eval by DrClance & desensitized to CPAP; tol well- resting well, no daytime issues, etc; still hasn't lost any weight...    HBP> on Diovan160, HCT25;  BP= 128/84 & he denies HA, visual sx, CP, palpit, SOB, edema, etc; he is an excellent golfer & plays regularly w/o problems...    Hyperlipid> on Livalo1mg ;  FLP 6/14 shows TChol 195, TG 261, HDL 38, LDL 115; we reviewed diet, exercise, wt reduction needed...    DM> on Metform500Bid;  Lab 12/14 reveals BS= 134, A1c= 7.4 and he does NOT want to incr meds- we discussed diet, exercise, wt reduction...    Overweight> weight= 239#,  71" tall,  BMI= 33; we reviewed diet, exercise, wt  reduction strategies...     GI- Divertics, colon polyps> last colonoscopy by DrStark 11/10 showed divertics, hems, tub adenoma & f/u planned 52yrs...     GU- BPH> on Flomax0.4, Viagra100; he had a PSA of 17 in 2003 w/ neg bx by DrNesi; labs 6/14 showed PSA= 0.53; Testos level= 282 but he denies energy problems, dysfunction, etc...     DJD> on Naprosyn OTC bid as needed...  We reviewed prob list, meds, xrays and labs> see below for updates >>  LABS 12/14:  Chems- ok x BS=134, A1c=7.4.Marland KitchenMarland Kitchen           Problem List:     Hx of OBSTRUCTIVE SLEEP APNEA (ICD-327.23) - eval in 1996 w/ sleep study showing RDI= 19, mod snoring, O2 sat to 90%... saw DrRedman for ENT due to snoring & tonsillar hypertrophy- offered UPPP surg but pt declined... since then he's gained weight, continued snoring & denied hypersom etc> not interested in further evaluation until wife c/o his snoring & restless sleeping;  Repeat seep study 1/13 revealed AHI=69 & desat to 72% w/ the obstructive events, split night protocol w/ CPAP titrate to 17cmH2O pressure... ~  4/12:  Pt states that wife does c/o his snoring but he states he rests well, wakes refreshed, no daytime symptoms... ~  1/13:  Repeat seep study 1/13 revealed AHI=69 & desat to 72% w/ the obstructive events, split night protocol w/ CPAP titrate to 17cmH2O pressure; He saw DrClance 2/13 & set up CPAP w/ mod pressure level for desensitization & will titrate it later; pt asked to work on weight reduction... ~  4/13: pt states that he is doing well on the CPAP; resting better, wakes refreshed, no further daytime hypersomnolence, sleep pressure, etc... ~  11/13: he had f/u DrClance> wearing CPAP compliantly but not able to tol optimal pressure; rests well, adeq daytime alertness, asked to work on wt reduction...  HYPERTENSION (ICD-401.9) - on DIOVAN/Hct 160-25 daily... ~  baseline EKG showed NSR, WNL.[name]. ~  baseline CXR showed clear, NAD; DJD spine. ~  1/13:  BP= 118/78 & feeling ok;  denies A, CP, palpit, SOB, swelling, etc... ~  4/13:  BP= 138/80 & he remains largely asymptomatic... ~  10/13:  BP= 110/62 & he denies HA, CP, palpit, SOB, edema, etc... ~  6/14: on Diovan160, HCT25;  BP= 126/88 & he denies HA, visual sx, CP, palpit, SOB, edema, etc; he is an excellent golfer & played 27 holes recently w/o problems ~  12/14: on DiovanHCT 160-25;  BP= 128/84 &  he remains asymptomatic...  HYPERCHOLESTEROLEMIA (ICD-272.0) - prev on Lip80, pt stopped on his own 1/12 due to cramping (resolved off the med); NOTE: he had mild incr LFTs as well while on the Lip80 & these ret to normal off the med; He notes similar cramping all over from Crestor20; Now on LIVALO 1mg /d & tol ok so far... ~  FLP 7/09 on Lip80 showed TChol 165, TG 165, HDL 37, LDL 95... same med, better diet, lose wt. ~  FLP 6/10 on Lip80 showed TChol 148, TG 74, HDL 44, LDL 90 ~  FLP 6/11 on Lip80 showed TChol 180, TG 148, HDL 43, LDL 108... needs to get wt down! ~  FLP 4/12 on diet alone showed TChol 257, TG 202, HDL41, LDL176... rec to start Crestor 20mg /d. ~  9/12 OV ==> not fasting for FLP today & he stopped Cres20 after 81mo due to cramping all over; rec trial LIVALO 1mg . ~  FLP 1/13 on Livalo1mg  showed TChol 222, TG 106, HDL 39, LDL 156... Improved but not at goal, continue same med & get wt down! ~  FLP 4/13 on Liv1mg  showed TChol 191, TG 114, HDL 41, LDL 127... imoroved further, continue same, better diet, get wt down! ~  FLP 6/14 on Liv1mg  showed TChol 195, TG 261, HDL 38, LDL 115  DIABETES MELLITUS >> hx elev FBS in past> diet Rx; started on METFORMIN ER 500mg Qam 4/12 w/ A1c up to 7.3.Marland KitchenMarland Kitchen ~  labs 6/10 showed BS= 111, A1c= 6.4... On diet alone. ~  labs 6/11 showed BS= 148, A1c= 6.6.Marland KitchenMarland Kitchen may need meds, get weight down. ~  labs 4/12 on diet alone showed BS= 132, A1c= 7.3.Marland KitchenMarland Kitchen rec to start MetforminER 500mg Qam. ~  Labs 9/12 on MetformER500/d showed BS=151, A1c=7.1.Marland KitchenMarland Kitchen Continue same... ~  Labs 1/13 on MetformER500/d showed  BS=171, A1c=7.4.Marland KitchenMarland Kitchen rec to incr Metformin to 500mg  Bid. ~  Labs 4/13 on Metform500Bid showed BS= 144, A1c= 6.7.Marland KitchenMarland Kitchen Continue same, GET WT DOWN! ~  Labs 10/13 on Metform500Bid showed BS= 122, A1c= 7.2... Again we stress wt reduction... ~  Labs 6/14 on Metform500Bid showed BS= 149, A1c= 6.9 ~  Labs 12/14 on Metform500Bid showed BS= 134, A1c= 7.4.Marland KitchenMarland Kitchen He refuses to incr meds therefore MUST get on diet & get weight down!  OVERWEIGHT (ICD-278.02) - as above: we discussed diet, exercise, get weight down... ~  weight 7/09 = 247# ~  weight 6/10 = 241# ~  weight 6/11 = 240# ~  Weight 4/12 = 244# ~  Weight 9/12 = 239# ~  Weight 1/13 = 241# ~  Weight 4/13 = 236# ~  Weight 10/13 = 231# ~  Weight 6/14 = 234# ~  Weight 12/14 = 239#  DIVERTICULOSIS OF COLON (ICD-562.10) & COLONIC POLYPS (ICD-211.3) ~  colonoscopy 7/07 by DrStark showed 7mm polyp= tubular adenoma... ~  colonoscopy 11/10 showed divertics, polyp= tub adenoma, +hems... f/u planned 44yrs.  LIVER FUNCTION TESTS, ABNORMAL, HX OF (ICD-V12.2) - could be Statin related vs Steatosis... ~  labs 6/10 showed AlkPhos=71, SGOT=47, SGPT=65 ~  labs 6/11 showed AlkPhos=61, SGOT=39, SGPT=57 ~  Labs 4/12 off statin Rx showed SGOT=30, SGPT=50 ~  Labs 1/13 on Livalo1 showed SGOT=38, SGPT=63 ~  Labs 4/13 on Livalo1 showed SGOT= 27, SGPT= 35... WNL on low dose Livalo. ~  Labs 6/14 on Liv1 showed SGOT= 30, SGPT= 41  BENIGN PROSTATIC HYPERTROPHY, HX OF (ICD-V13.8) - hx of elevated PSA w/ neg prostate bx's from [facility] in 2003 (PSA was 17)... ~  PSA 5/08 was 0.66... ~  PSA 7/09 is 0.52... ~  Eval 6/10 DrNesi: doing well, PSA reported 0.90 ~  labs 6/11 showed PSA= 0.59 ~  Labs 4/12 showed PSA= 0.68 ~  Labs 1/13 showed PSA= 1.55 & Testos level = 350 ~  Labs 4/13 showed Testos level = 331 & pt reports good energy, feeling well. ~  Labs 6/14 showed Testos level = 282  DEGENERATIVE JOINT DISEASE (ICD-715.90) - he uses ETODOLAC as needed.  Health Maintenance>  ~   GI:  Followed byDrStark w/ hx adenomatous polyps on colon check 2007 & 2010> f/u due 11/15 ~  GU:  Followed by Thermon Leyland w/ hx elev PSA & neg bx 2003; yearly PSAs all <1 since then & PSA 1/13= 1.55 ~  Immuniz:  He had Pneumovax in 2000 at age 38, repeat due when he turns 63;He refuses the seasonal FLU vaccine; Due for TDAP but wants to wait.   Past Surgical History  Procedure Laterality Date  . Vasectomy      by Dr. Brunilda Payor    Outpatient Encounter Prescriptions as of 07/12/2013  Medication Sig  . cetirizine (ZYRTEC) 10 MG tablet Take 10 mg by mouth daily as needed.   . clotrimazole-betamethasone (LOTRISONE) cream APPLY AS DIRECTED  . glucose blood test strip Test blood once a day as directed. For One Touch Device.  . metFORMIN (GLUCOPHAGE) 500 MG tablet Take 1 tablet (500 mg total) by mouth 2 (two) times daily with a meal.  . multivitamin (THERAGRAN) per tablet Take 1 tablet by mouth daily.    . ONE TOUCH LANCETS MISC Use as directed for blood glucose monitoring  . Pitavastatin Calcium 1 MG TABS Take 1 tablet (1 mg total) by mouth at bedtime.  . sildenafil (VIAGRA) 100 MG tablet Take 100 mg by mouth daily as needed.    . tamsulosin (FLOMAX) 0.4 MG CAPS capsule Take 1 capsule (0.4 mg total) by mouth at bedtime.  . valsartan-hydrochlorothiazide (DIOVAN-HCT) 160-25 MG per tablet Take 1 tablet by mouth daily.  . colchicine 0.6 MG tablet Take 0.6 mg by mouth 2 (two) times daily as needed (for GOUT flares).    No Known Allergies   Current Medications, Allergies, Past Medical History, Past Surgical History, Family History, and Social History were reviewed in [employer] record.    Review of Systems       The patient complains of some arthritis.  The patient denies fever, chills, sweats, anorexia, fatigue, weakness, malaise, weight loss, sleep disorder, blurring, diplopia, eye irritation, eye discharge, vision loss, eye pain, photophobia, earache, ear discharge, tinnitus,  decreased hearing, nasal congestion, nosebleeds, sore throat, hoarseness, chest pain, syncope, dyspnea on exertion, orthopnea, PND, peripheral edema, cough, dyspnea at rest, excessive sputum, hemoptysis, wheezing, pleurisy, nausea, vomiting, diarrhea, constipation, change in bowel habits, abdominal pain, melena, hematochezia, jaundice, gas/bloating, indigestion/heartburn, dysphagia, odynophagia, dysuria, hematuria, urinary frequency, urinary hesitancy, nocturia, incontinence, back pain, joint pain, joint swelling, muscle cramps, muscle weakness, stiffness, sciatica, restless legs, leg pain at night, leg pain with exertion, rash, itching, dryness, suspicious lesions, paralysis, paresthesias, seizures, tremors, vertigo, transient blindness, frequent falls, frequent headaches, difficulty walking, depression, anxiety, memory loss, confusion, cold intolerance, heat intolerance, polydipsia, polyphagia, polyuria, unusual weight change, abnormal bruising, bleeding, enlarged lymph nodes, urticaria, allergic rash, hay fever, and recurrent infections.   Objective:   Physical Exam     WD, Overweight, 62 y/o BM in NAD... Vital Signs:  Reviewed w/ pt... GENERAL:  Alert & oriented; pleasant & cooperative... HEENT:  [place]/AT, EOM-wnl, PERRLA,  EACs- +cerumen, TMs-wnl, NOSE-clear, THROAT-clear & wnl. NECK:  Supple w/ full ROM; no JVD; normal carotid impulses w/o bruits; no thyromegaly or nodules palpated; no lymphadenopathy. CHEST:  Clear to P & A; without wheezes/ rales/ or rhonchi heard... HEART:  Regular Rhythm; without murmurs/ rubs/ or gallops detected... ABDOMEN:  Soft & nontender; normal bowel sounds; no organomegaly or masses palpated... RECTAL:  Sl boggy & tender prostate... EXT: without deformities or arthritic changes; no varicose veins/ venous insuffic/ or edema. NEURO:  CN's intact; motor testing normal; sensory testing normal; gait normal & balance OK DERM:  No lesions noted; no rash etc...  RADIOLOGY  DATA:  Reviewed in the EPIC EMR & discussed w/ the patient...  LABORATORY DATA:  Reviewed in the EPIC EMR & discussed w/ the patient...   Assessment & Plan:    OSA>  Sleep study was pos for OSA w/ AHI=69 & desat to 72%; started on CPAP via full face mask titrated to CPAP=17 which he trouble tolerating- followed by DrClance...  HBP>  Controlled on Diovan/HCT;  Continue same meds, needs better diet, get wt down!  CHOL>  Lipids are improved but not at goals on Livalo 1mg Qhs; rec same med, better diet & must get wt down or incr med next time...  DM>  FBS now 134 & A1c=7.4 (on MetformER500Bid);  He hasn't been able to effectively diet or exercise;  He needs to lose a substantial amt of weight & we discussed 10-15 lb increments; in the meanwhile> continue same med...  UTI, BPH, Hx elev PSA>  Prev UTI symptoms & PROSTATITIS (EColi, pan-sens & ok to treat w/ SeptraDS vs Cipro) resolved & current UA C&S is neg==> resolved;  We will also give a trial of FLOMAX 0.4mg /d==> improved.  Other medical problems as noted...   Patient's Medications  New Prescriptions   No medications on file  Previous Medications   CETIRIZINE (ZYRTEC) 10 MG TABLET    Take 10 mg by mouth daily as needed.    CLOTRIMAZOLE-BETAMETHASONE (LOTRISONE) CREAM    APPLY AS DIRECTED   COLCHICINE 0.6 MG TABLET    Take 0.6 mg by mouth 2 (two) times daily as needed (for GOUT flares).   GLUCOSE BLOOD TEST STRIP    Test blood once a day as directed. For One Touch Device.   METFORMIN (GLUCOPHAGE) 500 MG TABLET    Take 1 tablet (500 mg total) by mouth 2 (two) times daily with a meal.   MULTIVITAMIN (THERAGRAN) PER TABLET    Take 1 tablet by mouth daily.     ONE TOUCH LANCETS MISC    Use as directed for blood glucose monitoring   PITAVASTATIN CALCIUM 1 MG TABS    Take 1 tablet (1 mg total) by mouth at bedtime.   SILDENAFIL (VIAGRA) 100 MG TABLET    Take 100 mg by mouth daily as needed.     TAMSULOSIN (FLOMAX) 0.4 MG CAPS CAPSULE    Take  1 capsule (0.4 mg total) by mouth at bedtime.   VALSARTAN-HYDROCHLOROTHIAZIDE (DIOVAN-HCT) 160-25 MG PER TABLET    Take 1 tablet by mouth daily.  Modified Medications   No medications on file  Discontinued Medications   No medications on file

## 2013-07-12 NOTE — Patient Instructions (Signed)
Today we updated your med list in our EPIC system...    Continue your current medications the same...  Today we did your follow up DM Labs...    We will contact you w/ the results when available...   Let's get on track w/ our diet & exercise program!!  Call for any questions...  Let's plan a follow up visit in 68mo w/ fasting blood work at that time.Levi Lee

## 2013-09-16 ENCOUNTER — Telehealth: Payer: Self-pay | Admitting: Pulmonary Disease

## 2013-09-16 MED ORDER — AZITHROMYCIN 250 MG PO TABS: ORAL_TABLET | ORAL | Status: DC

## 2013-09-16 NOTE — Telephone Encounter (Signed)
Pt aware of rercs. Nothing further needed

## 2013-09-16 NOTE — Telephone Encounter (Signed)
Per SN---  zpak #1  Take as directed otc mucinex 2 po bid Increase fluids otc throat lozenges as needed.

## 2013-09-16 NOTE — Telephone Encounter (Signed)
Called spoke with patient who reports head congestion with clear-yellow-green drainage, sore throat that is also red, PND x3 days.  Denies any wheezing, increased SOB, chest tightness, f/c/s, nausea, vomiting, hemoptysis.  Pt is requesting a zpak unless SN feels another abx would be better.  [facility] NKDA - verified Last ov 12.15.14 w/ SN  Dr Lenna Gilford please advise, thank you.  Current Outpatient Prescriptions on File Prior to Visit  Medication Sig Dispense Refill  . cetirizine (ZYRTEC) 10 MG tablet Take 10 mg by mouth daily as needed.       . clotrimazole-betamethasone (LOTRISONE) cream APPLY AS DIRECTED  15 g  1  . colchicine 0.6 MG tablet Take 0.6 mg by mouth 2 (two) times daily as needed (for GOUT flares).      [name] glucose blood test strip Test blood once a day as directed. For One Touch Device.  300 each  3  . metFORMIN (GLUCOPHAGE) 500 MG tablet Take 1 tablet (500 mg total) by mouth 2 (two) times daily with a meal.  180 tablet  3  . multivitamin (THERAGRAN) per tablet Take 1 tablet by mouth daily.        . ONE TOUCH LANCETS MISC Use as directed for blood glucose monitoring  600 each  3  . Pitavastatin Calcium 1 MG TABS Take 1 tablet (1 mg total) by mouth at bedtime.  90 tablet  3  . sildenafil (VIAGRA) 100 MG tablet Take 100 mg by mouth daily as needed.        . tamsulosin (FLOMAX) 0.4 MG CAPS capsule Take 1 capsule (0.4 mg total) by mouth at bedtime.  30 capsule  11  . valsartan-hydrochlorothiazide (DIOVAN-HCT) 160-25 MG per tablet Take 1 tablet by mouth daily.  90 tablet  3   No current facility-administered medications on file prior to visit.

## 2013-10-13 ENCOUNTER — Telehealth: Payer: Self-pay | Admitting: Pulmonary Disease

## 2013-10-13 DIAGNOSIS — Z7689 Persons encountering health services in other specified circumstances: Secondary | ICD-10-CM

## 2013-10-13 MED ORDER — COLCHICINE 0.6 MG PO TABS: 0.6000 mg | ORAL_TABLET | Freq: Two times a day (BID) | ORAL | Status: AC | PRN

## 2013-10-13 MED ORDER — CLOTRIMAZOLE-BETAMETHASONE 1-0.05 % EX CREA: TOPICAL_CREAM | CUTANEOUS | Status: AC

## 2013-10-13 NOTE — Telephone Encounter (Signed)
Per SN---  colcrys  0.4 mg   #60  1 po bid Ok lotrisone refill.    Please explain to the pt and SN will be retiring from primary care and we will need to establish with a new primary care doctor at another [place] site.  thanks

## 2013-10-13 NOTE — Telephone Encounter (Signed)
Pt aware. rx's sent. Pt aware SN retiring from [facility]. Referral placed for [place]. He has been giving # to call schedule appt. Nothing further needed

## 2013-10-13 NOTE — Telephone Encounter (Signed)
Called spoke with pt. C/o gout in right foot big toe x Sunday. Requesting colcrys to be called in. Has been taking ibuprofen. Pt reports he has not had this medication called in since last year. Also needed refill on Lotrisone cream (rx sent). Please advise SN thanks

## 2013-11-04 ENCOUNTER — Telehealth: Payer: Self-pay | Admitting: Pulmonary Disease

## 2013-11-04 MED ORDER — AZITHROMYCIN 250 MG PO TABS: 250.0000 mg | ORAL_TABLET | ORAL | Status: DC

## 2013-11-04 NOTE — Telephone Encounter (Signed)
Spouse aware of recs.

## 2013-11-04 NOTE — Telephone Encounter (Signed)
Spoke with pt. C/o nasal congestion, chest cong, PND, prod cough w/ yellow phlem. No PND, no wheezing, no chest tx, no f/c/s/n/v. Pt is taking coridin and using flonase. Asking for recs since they are getting ready to go out of town. Please advise CDY thanks  No Known Allergies   Current Outpatient Prescriptions on File Prior to Visit  Medication Sig Dispense Refill  . azithromycin (ZITHROMAX) 250 MG tablet Take as directed  6 tablet  0  . cetirizine (ZYRTEC) 10 MG tablet Take 10 mg by mouth daily as needed.       . clotrimazole-betamethasone (LOTRISONE) cream APPLY AS DIRECTED  15 g  1  . colchicine 0.6 MG tablet Take 1 tablet (0.6 mg total) by mouth 2 (two) times daily as needed (for GOUT flares).  60 tablet  0  . glucose blood test strip Test blood once a day as directed. For One Touch Device.  300 each  3  . metFORMIN (GLUCOPHAGE) 500 MG tablet Take 1 tablet (500 mg total) by mouth 2 (two) times daily with a meal.  180 tablet  3  . multivitamin (THERAGRAN) per tablet Take 1 tablet by mouth daily.        . ONE TOUCH LANCETS MISC Use as directed for blood glucose monitoring  600 each  3  . Pitavastatin Calcium 1 MG TABS Take 1 tablet (1 mg total) by mouth at bedtime.  90 tablet  3  . sildenafil (VIAGRA) 100 MG tablet Take 100 mg by mouth daily as needed.        . tamsulosin (FLOMAX) 0.4 MG CAPS capsule Take 1 capsule (0.4 mg total) by mouth at bedtime.  30 capsule  11  . valsartan-hydrochlorothiazide (DIOVAN-HCT) 160-25 MG per tablet Take 1 tablet by mouth daily.  90 tablet  3   No current facility-administered medications on file prior to visit.

## 2013-11-04 NOTE — Telephone Encounter (Signed)
Mindy spoke with spouse and notified of recs  She verbalized understanding  Rx was sent to pharm

## 2013-11-04 NOTE — Telephone Encounter (Signed)
Suggest Zpak and  otc Mucinex plus otc Sudafed-PE

## 2013-11-10 ENCOUNTER — Telehealth: Payer: Self-pay | Admitting: Pulmonary Disease

## 2013-11-10 MED ORDER — VALSARTAN-HYDROCHLOROTHIAZIDE 160-25 MG PO TABS: 1.0000 | ORAL_TABLET | Freq: Every day | ORAL | Status: DC

## 2013-11-10 NOTE — Telephone Encounter (Signed)
Spoke with pt's wife and she states that pt will be getting appt to see new physician at [facility] when he comes in July.  Rx refilled until then.

## 2014-01-14 ENCOUNTER — Ambulatory Visit: Payer: Federal, State, Local not specified - PPO | Admitting: Pulmonary Disease

## 2014-04-06 ENCOUNTER — Encounter: Payer: Self-pay | Admitting: Gastroenterology

## 2014-04-12 ENCOUNTER — Ambulatory Visit (INDEPENDENT_AMBULATORY_CARE_PROVIDER_SITE_OTHER): Payer: Medicare Other | Admitting: Pulmonary Disease

## 2014-04-12 ENCOUNTER — Encounter: Payer: Self-pay | Admitting: Pulmonary Disease

## 2014-04-12 VITALS — BP 132/80 | HR 65 | Temp 98.2°F | Ht 71.0 in | Wt 233.8 lb

## 2014-04-12 DIAGNOSIS — G4733 Obstructive sleep apnea (adult) (pediatric): Secondary | ICD-10-CM

## 2014-04-12 NOTE — Assessment & Plan Note (Signed)
The patient is doing very well from a sleep apnea standpoint on his current CPAP setup. He is satisfied with his sleep and daytime alertness, and is trying to work on weight loss. He did recently switch over to Medicare, and apparently need something sent to his home care company. We will check into this and send the appropriate forms.

## 2014-04-12 NOTE — Progress Notes (Signed)
   Subjective:    Patient ID: Levi Lee, male    DOB: 1950/11/01, 63 y.o.   MRN: [id number]  HPI The patient comes in today for followup of his obstructive sleep apnea. He is wearing CPAP compliantly, and is satisfied with his sleep and daytime alertness. He is having no issues with his mask fit or pressure. He has lost a little bit of weight since the last visit.   Review of Systems  Constitutional: Negative for fever and unexpected weight change.  HENT: Negative for congestion, dental problem, ear pain, nosebleeds, postnasal drip, rhinorrhea, sinus pressure, sneezing, sore throat and trouble swallowing.   Eyes: Negative for redness and itching.  Respiratory: Negative for cough, chest tightness, shortness of breath and wheezing.   Cardiovascular: Negative for palpitations and leg swelling.  Gastrointestinal: Negative for nausea and vomiting.  Genitourinary: Negative for dysuria.  Musculoskeletal: Negative for joint swelling.  Skin: Negative for rash.  Neurological: Negative for headaches.  Hematological: Does not bruise/bleed easily.  Psychiatric/Behavioral: Negative for dysphoric mood. The patient is not nervous/anxious.        Objective:   Physical Exam Overweight male in no acute distress Nose without purulence or discharge noted No skin breakdown or pressure necrosis from the CPAP mask Neck without lymphadenopathy or thyromegaly Lower extremities without significant edema, no cyanosis Alert, does not appear to be sleepy, moves all 4 extremities.       Assessment & Plan:

## 2014-04-12 NOTE — Patient Instructions (Signed)
Continue on cpap, and keep up with mask changes and supplies. Keep working on weight loss followup with me again in one year.  

## 2014-07-12 ENCOUNTER — Ambulatory Visit: Payer: Federal, State, Local not specified - PPO | Admitting: Pulmonary Disease

## 2014-10-31 ENCOUNTER — Encounter: Payer: Self-pay | Admitting: Gastroenterology

## 2015-01-04 ENCOUNTER — Encounter: Payer: Self-pay | Admitting: Gastroenterology

## 2015-02-20 ENCOUNTER — Ambulatory Visit (AMBULATORY_SURGERY_CENTER): Payer: Self-pay

## 2015-02-20 VITALS — Ht 71.0 in | Wt 238.0 lb

## 2015-02-20 DIAGNOSIS — Z860100 Personal history of colon polyps, unspecified: Secondary | ICD-10-CM

## 2015-02-20 NOTE — Progress Notes (Signed)
No allergies to eggs or soy No past problems with anesthesia No home oxygen No diet/weight loss meds  Does not want emmi video; has had 2 before per pt

## 2015-03-14 ENCOUNTER — Ambulatory Visit (AMBULATORY_SURGERY_CENTER): Payer: Medicare Other | Admitting: Gastroenterology

## 2015-03-14 ENCOUNTER — Encounter: Payer: Self-pay | Admitting: Gastroenterology

## 2015-03-14 VITALS — BP 121/78 | HR 58 | Temp 95.3°F | Resp 46 | Ht 71.0 in | Wt 238.0 lb

## 2015-03-14 DIAGNOSIS — D123 Benign neoplasm of transverse colon: Secondary | ICD-10-CM

## 2015-03-14 DIAGNOSIS — Z86010 Personal history of colonic polyps: Secondary | ICD-10-CM

## 2015-03-14 DIAGNOSIS — K635 Polyp of colon: Secondary | ICD-10-CM | POA: Diagnosis not present

## 2015-03-14 MED ORDER — SODIUM CHLORIDE 0.9 % IV SOLN: 500.0000 mL | INTRAVENOUS | Status: DC

## 2015-03-14 NOTE — Progress Notes (Signed)
Called to room to assist during endoscopic procedure.  Patient ID and intended procedure confirmed with present staff. Received instructions for my participation in the procedure from the performing physician.  

## 2015-03-14 NOTE — Op Note (Signed)
[facility]  [address] [place], [address]   COLONOSCOPY PROCEDURE REPORT  PATIENT: Levi Lee, Levi Lee  MR#: [id number] BIRTHDATE: 1950/08/16 , 63  yrs. old GENDER: male ENDOSCOPIST: Ladene Artist, MD, [facility] REFERRED BY: Anastasia Pall, MD PROCEDURE DATE:  03/14/2015 PROCEDURE:   Colonoscopy, surveillance and Colonoscopy with snare polypectomy First Screening Colonoscopy - Avg.  risk and is 50 yrs.  old or older - No.  Prior Negative Screening - Now for repeat screening. N/A  History of Adenoma - Now for follow-up colonoscopy & has been > or = to 3 yrs.  Yes hx of adenoma.  Has been 3 or more years since last colonoscopy.  Polyps removed today? Yes ASA CLASS:   Class II INDICATIONS:Surveillance due to prior colonic neoplasia and PH Colon Adenoma. MEDICATIONS: Monitored anesthesia care and Propofol 200 mg IV DESCRIPTION OF PROCEDURE:   After the risks benefits and alternatives of the procedure were thoroughly explained, informed consent was obtained.  The digital rectal exam revealed no abnormalities of the rectum.   The LB [id number]  endoscope was introduced through the anus and advanced to the cecum, which was identified by both the appendix and ileocecal valve. No adverse events experienced.   The quality of the prep was good.  (Suprep was used)  The instrument was then slowly withdrawn as the colon was fully examined. Estimated blood loss is zero unless otherwise noted in this procedure report.    COLON FINDINGS: A sessile polyp measuring 5 mm in size was found in the transverse colon.  A polypectomy was performed with a cold snare.  The resection was complete, the polyp tissue was completely retrieved and sent to histology.   There was moderate diverticulosis noted in the sigmoid colon and ascending colon. The examination was otherwise normal.  Retroflexed views revealed internal Grade I hemorrhoids. The time to cecum = 1.6 Withdrawal time = 11.1   The scope was  withdrawn and the procedure completed. COMPLICATIONS: There were no immediate complications.  ENDOSCOPIC IMPRESSION: 1.   Sessile polyp in the transverse colon; polypectomy performed with a cold snare 2.   Moderate diverticulosis in the sigmoid colon and ascending colon 3.   Grade l internal hemorrhoids  RECOMMENDATIONS: 1.  Await pathology results 2.  High fiber diet with liberal fluid intake. 3.  Repeat Colonoscopy in 5 years.  eSigned:  Ladene Artist, MD, [facility] 03/14/2015 9:57 AM

## 2015-03-14 NOTE — Progress Notes (Signed)
Report to PACU, RN, vss, BBS= Clear.  

## 2015-03-14 NOTE — Patient Instructions (Signed)
YOU HAD AN ENDOSCOPIC PROCEDURE TODAY AT [facility]:   Refer to the procedure report that was given to you for any specific questions about what was found during the examination.  If the procedure report does not answer your questions, please call your gastroenterologist to clarify.  If you requested that your care partner not be given the details of your procedure findings, then the procedure report has been included in a sealed envelope for you to review at your convenience later.  YOU SHOULD EXPECT: Some feelings of bloating in the abdomen. Passage of more gas than usual.  Walking can help get rid of the air that was put into your GI tract during the procedure and reduce the bloating. If you had a lower endoscopy (such as a colonoscopy or flexible sigmoidoscopy) you may notice spotting of blood in your stool or on the toilet paper. If you underwent a bowel prep for your procedure, you may not have a normal bowel movement for a few days.  Please Note:  You might notice some irritation and congestion in your nose or some drainage.  This is from the oxygen used during your procedure.  There is no need for concern and it should clear up in a day or so.  SYMPTOMS TO REPORT IMMEDIATELY:   Following lower endoscopy (colonoscopy or flexible sigmoidoscopy):  Excessive amounts of blood in the stool  Significant tenderness or worsening of abdominal pains  Swelling of the abdomen that is new, acute  Fever of 100F or higher   For urgent or emergent issues, a gastroenterologist can be reached at any hour by calling [phone].   DIET: Your first meal following the procedure should be a small meal and then it is ok to progress to your normal diet. Heavy or fried foods are harder to digest and may make you feel nauseous or bloated.  Likewise, meals heavy in dairy and vegetables can increase bloating.  Drink plenty of fluids but you should avoid alcoholic beverages for 24  hours.  ACTIVITY:  You should plan to take it easy for the rest of today and you should NOT DRIVE or use heavy machinery until tomorrow (because of the sedation medicines used during the test).    FOLLOW UP: Our staff will call the number listed on your records the next business day following your procedure to check on you and address any questions or concerns that you may have regarding the information given to you following your procedure. If we do not reach you, we will leave a message.  However, if you are feeling well and you are not experiencing any problems, there is no need to return our call.  We will assume that you have returned to your regular daily activities without incident.  If any biopsies were taken you will be contacted by phone or by letter within the next 1-3 weeks.  Please call us at [phone] if you have not heard about the biopsies in 3 weeks.    SIGNATURES/CONFIDENTIALITY: You and/or your care partner have signed paperwork which will be entered into your electronic medical record.  These signatures attest to the fact that that the information above on your After Visit Summary has been reviewed and is understood.  Full responsibility of the confidentiality of this discharge information lies with you and/or your care -partner.  Polyp/ diverticulosis handout given Await pathology report  Repeat colonoscopy in 5 years

## 2015-03-15 ENCOUNTER — Telehealth: Payer: Self-pay

## 2015-03-15 NOTE — Telephone Encounter (Signed)
  Follow up Call-  Call back number 03/14/2015  Post procedure Call Back phone  # [phone]  Permission to leave phone message Yes     Patient questions:  Do you have a fever, pain , or abdominal swelling? No. Pain Score  0 *  Have you tolerated food without any problems? Yes.    Have you been able to return to your normal activities? Yes.    Do you have any questions about your discharge instructions: Diet   No. Medications  No. Follow up visit  No.  Do you have questions or concerns about your Care? No.  Actions: * If pain score is 4 or above: No action needed, pain <4.

## 2015-03-20 ENCOUNTER — Encounter: Payer: Self-pay | Admitting: Gastroenterology

## 2015-04-11 ENCOUNTER — Ambulatory Visit: Payer: Federal, State, Local not specified - PPO | Admitting: Pulmonary Disease

## 2015-09-06 ENCOUNTER — Telehealth: Payer: Self-pay

## 2015-09-06 ENCOUNTER — Encounter: Payer: Self-pay | Admitting: Pulmonary Disease

## 2015-09-06 ENCOUNTER — Ambulatory Visit (INDEPENDENT_AMBULATORY_CARE_PROVIDER_SITE_OTHER): Payer: Medicare Other | Admitting: Pulmonary Disease

## 2015-09-06 VITALS — BP 128/72 | HR 74 | Ht 71.0 in | Wt 230.0 lb

## 2015-09-06 DIAGNOSIS — G4733 Obstructive sleep apnea (adult) (pediatric): Secondary | ICD-10-CM | POA: Diagnosis not present

## 2015-09-06 DIAGNOSIS — E669 Obesity, unspecified: Secondary | ICD-10-CM | POA: Diagnosis not present

## 2015-09-06 NOTE — Patient Instructions (Signed)
  You are diagnosed with Obstructive Sleep Apnea or OSA.  You stop breathing  Every minute. (68 times/hr)  Cont CPAP. We will try to order a new CPAP machine.  We will change your pressure to 5-12 cm H2O. Let us know if you don't tolerate this.  We will order cpap supplies.   Please make sure you use your CPAP device everytime you sleep.  We will monitor the usage of your machine per your insurance requirement.  Your insurance company may take the machine from you if you are not using it regularly.   Please clean the mask, tubings, filter, water reservoir with soapy water every week.  Please use distilled water for the water reservoir.   Please call the office or your machine provider (DME company) if you are having issues with the device.     Return to clinic in 1 yr   J. Angelo A. Corrie Dandy, MD Pulmonary and [place] [phone], Fax: [phone]

## 2015-09-06 NOTE — Telephone Encounter (Signed)
Pt needs follow-up in 1 month. AD schedule not available yet.

## 2015-09-06 NOTE — Assessment & Plan Note (Signed)
Advised on weight reduction. Lifestyle changes. Cont cpap use.

## 2015-09-06 NOTE — Telephone Encounter (Signed)
ERROR. Appt should be for 1 year.

## 2015-09-06 NOTE — Progress Notes (Signed)
Subjective:    Patient ID: Levi Lee, male    DOB: 06-Mar-1951, 65 y.o.   MRN: [id number]  HPI   This is the case of Levi Lee, 65 y.o. Male, who is in the office today for follow up on is OSA. Pt was previously seen by Dr. Gwenette Greet. Last seen in 03/2014. Split night study (07/2011)  AHI 69. Was on CPAP and Bipap. No optimal settings.  On CPAP 10 cm H2O. Last 3 mos --  98% usage. AHI 9.  He was tried on higher cpap settings before but he did NOT tolerate the pressure.  He feels better using the machine. More energy, less sleepiness.  He feels benefit of CPAP machine.  Sometimes, machine is noisy and not delivering enough pressure. Likely machine is malfxning a bit sometimes.   Has not been admitted since last seen here. No new medical dx. No new meds.       Review of Systems  Constitutional: Negative.   HENT: Negative.   Eyes: Negative.   Respiratory: Positive for shortness of breath. Negative for choking, chest tightness, wheezing and stridor.   Cardiovascular: Negative.   Gastrointestinal: Negative.   Endocrine: Negative.   Genitourinary: Negative.   Musculoskeletal: Negative.   Allergic/Immunologic: Negative.   Neurological: Negative.   Hematological: Negative.   Psychiatric/Behavioral: Negative.   All other systems reviewed and are negative.       Past Medical History  Diagnosis Date  . OSA (obstructive sleep apnea)   . Hypertension   . Hypercholesterolemia   . Other abnormal glucose   . Overweight(278.02)   . Diverticulosis of colon   . Hx of colonic polyps   . Abnormal liver function tests   . BPH (benign prostatic hyperplasia)   . DJD (degenerative joint disease)   . Diabetes mellitus without complication ([place])   . Sleep apnea     wears CPAP     Family History  Problem Relation Age of Onset  . Cancer Paternal Uncle   . Colon cancer Neg Hx   . Esophageal cancer Neg Hx   . Rectal cancer Neg Hx   . Stomach cancer Neg Hx      Past Surgical  History  Procedure Laterality Date  . Vasectomy      by Dr. Janice Norrie    Social History   Social History  . Marital Status: Married    Spouse Name: carolyn marie Imperato  . Number of Children: 2  . 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     Social History Main Topics  . Smoking status: Former Smoker -- 0.30 packs/day for 10 years    Types: Cigarettes    Quit date: 07/29/1978  . Smokeless tobacco: Never Used  . Alcohol Use: Yes     Comment: social use  . Drug Use: No  . Sexual Activity: Not on file   Other Topics Concern  . Not on file   Social History Narrative     Allergies  Allergen Reactions  . Statins Other (See Comments)    Severe muscle cramps     Outpatient Prescriptions Prior to Visit  Medication Sig Dispense Refill  . aspirin 81 MG EC tablet Take 81 mg by mouth daily.    . cetirizine (ZYRTEC) 10 MG tablet Take 10 mg by mouth daily as needed.     . clotrimazole-betamethasone (LOTRISONE) cream APPLY AS DIRECTED 15 g 1  .  colchicine 0.6 MG tablet Take 1 tablet (0.6 mg total) by mouth 2 (two) times daily as needed (for GOUT flares). 60 tablet 0  . glucose blood test strip Test blood once a day as directed. For One Touch Device. 300 each 3  . multivitamin (THERAGRAN) per tablet Take 1 tablet by mouth daily.      . ONE TOUCH LANCETS MISC Use as directed for blood glucose monitoring 600 each 3  . sildenafil (VIAGRA) 100 MG tablet Take 100 mg by mouth daily as needed.      . tamsulosin (FLOMAX) 0.4 MG CAPS capsule Take 1 capsule (0.4 mg total) by mouth at bedtime. 30 capsule 11  . valsartan-hydrochlorothiazide (DIOVAN-HCT) 160-25 MG per tablet Take 1 tablet by mouth daily.    . metFORMIN (GLUCOPHAGE) 500 MG tablet Take 1 tablet (500 mg total) by mouth 2 (two) times daily with a meal. 180 tablet 3  . metFORMIN (GLUCOPHAGE) 500 MG tablet Take 500 mg by mouth 2 (two) times daily with a meal.    . valsartan-hydrochlorothiazide  (DIOVAN-HCT) 160-25 MG per tablet Take 1 tablet by mouth daily. 90 tablet 0   No facility-administered medications prior to visit.   Meds ordered this encounter  Medications  . metFORMIN (GLUCOPHAGE) 1000 MG tablet    Sig: Take 1 tablet by mouth 2 (two) times daily.       Objective:   Physical Exam   Vitals:  Filed Vitals:   09/06/15 1340  BP: 128/72  Pulse: 74  Height: 5\' 11"  (1.803 m)  Weight: 230 lb (104.327 kg)  SpO2: 97%    Constitutional/General:  Pleasant, well-nourished, well-developed, not in any distress,  Comfortably seating.  Well kempt  Body mass index is 32.09 kg/(m^2).  HEENT: Pupils equal and reactive to light and accommodation. Anicteric sclerae. Normal nasal mucosa.   No oral  lesions,  mouth clear,  oropharynx clear, no postnasal drip. (-) Oral thrush. No dental caries.  Airway - Mallampati class III-IV  Neck: No masses. Midline trachea. No JVD, (-) LAD. (-) bruits appreciated.  Respiratory/Chest: Grossly normal chest. (-) deformity. (-) Accessory muscle use.  Symmetric expansion. (-) Tenderness on palpation.  Resonant on percussion.  Diminished BS on both lower lung zones. (-) wheezing, crackles, rhonchi (-) egophony  Cardiovascular: Regular rate and  rhythm, heart sounds normal, no murmur or gallops, no peripheral edema  Gastrointestinal:  Normal bowel sounds. Soft, non-tender. No hepatosplenomegaly.  (-) masses.   Musculoskeletal:  Normal muscle tone. Normal gait.   Extremities: Grossly normal. (-) clubbing, cyanosis.  (-) edema  Skin: (-) rash,lesions seen.   Neurological/Psychiatric : alert, oriented to time, place, person. Normal mood and affect         Assessment & Plan:  Obstructive sleep apnea Pt feels benefit of cpap. Feels better. Less sleepiness. Sometimes, machine is NOT delivering enough pressure and is noisy.  He was switched to Medicare in 2016.   DL x 3 mos (08/2015)  AHI 9,  98%.  Plan: 1. Will try to get a  new cpap (autocpap) as he was switched to medicare last yr. If he is required to have a sleep study, pt wants to hold off on new cpap until 08/2016 which will be 5 yrs.  2. Will try to increase pressure to autocpap 5-12 as ahi is slightly elevated. If he does not tolerate, will revert to cpap 10 cm H2O.  3. Will order supplies.  4. Advised pt to clean mask, tubings, filter.  Obesity Advised on weight reduction. Lifestyle changes. Cont cpap use.    Patient will follow up with me in 1 yr.     Elsie Saas A. Corrie Dandy, MD Pulmonary and [place] [phone], Fax: [phone]

## 2015-09-06 NOTE — Assessment & Plan Note (Signed)
Pt feels benefit of cpap. Feels better. Less sleepiness. Sometimes, machine is NOT delivering enough pressure and is noisy.  He was switched to Medicare in 2016.   DL x 3 mos (08/2015)  AHI 9,  98%.  Plan: 1. Will try to get a new cpap (autocpap) as he was switched to medicare last yr. If he is required to have a sleep study, pt wants to hold off on new cpap until 08/2016 which will be 5 yrs.  2. Will try to increase pressure to autocpap 5-12 as ahi is slightly elevated. If he does not tolerate, will revert to cpap 10 cm H2O.  3. Will order supplies.  4. Advised pt to clean mask, tubings, filter.

## 2015-09-13 ENCOUNTER — Telehealth: Payer: Self-pay | Admitting: Pulmonary Disease

## 2015-09-13 DIAGNOSIS — G4733 Obstructive sleep apnea (adult) (pediatric): Secondary | ICD-10-CM

## 2015-09-13 NOTE — Telephone Encounter (Signed)
Called spoke with pt. [facility] is no longer accepting his insurance. He needs new DME. I have placed new order. Nothing further needed

## 2015-11-08 ENCOUNTER — Encounter: Payer: Self-pay | Admitting: Pulmonary Disease

## 2015-11-08 ENCOUNTER — Ambulatory Visit (INDEPENDENT_AMBULATORY_CARE_PROVIDER_SITE_OTHER): Payer: Medicare Other | Admitting: Pulmonary Disease

## 2015-11-08 VITALS — BP 118/72 | HR 72 | Ht 71.0 in | Wt 229.0 lb

## 2015-11-08 DIAGNOSIS — E669 Obesity, unspecified: Secondary | ICD-10-CM | POA: Diagnosis not present

## 2015-11-08 DIAGNOSIS — G4733 Obstructive sleep apnea (adult) (pediatric): Secondary | ICD-10-CM

## 2015-11-08 NOTE — Assessment & Plan Note (Addendum)
NPSG 2013:  AHI 69/hr, optimal cpap 17cm (pressures greater resulted in increased centrals)  Pt feels benefit of cpap. Feels better. Less sleepiness. Sometimes, machine is NOT delivering enough pressure and is noisy.  He was switched to Medicare in 2016.   DL x 3 mos (08/2015)  AHI 9,  98%. Auto 5-12 DL for 10/2015 83%, AHI 8.  Auto 5-12  Plan: 1. New machine in feb 2017.  2. Cont cpap. Feels benefit of cpap. More energy. Less sleepiness. Feels benefit of cpap.  2. Will try to increase pressure to autocpap 5-15 2/2 elevated AHI. Need 1 month DL after. May be having centrals per history

## 2015-11-08 NOTE — Progress Notes (Signed)
Subjective:    Patient ID: Levi Lee, male    DOB: 1950-10-19, 65 y.o.   MRN: [id number]  HPI   This is the case of Levi Lee, 65 y.o. Male, who is in the office today for follow up on is OSA. Pt was previously seen by Dr. Gwenette Greet. Last seen in 03/2014. Split night study (07/2011)  AHI 69. Was on CPAP and Bipap. No optimal settings.  On CPAP 10 cm H2O. Last 3 mos --  98% usage. AHI 9.  He was tried on higher cpap settings before but he did NOT tolerate the pressure.  He feels better using the machine. More energy, less sleepiness.  He feels benefit of CPAP machine.  Sometimes, machine is noisy and not delivering enough pressure. Likely machine is malfxning a bit sometimes.   Has not been admitted since last seen here. No new medical dx. No new meds.   ROV (11/08/15) Pt returns to office for f/u on his osa. Since last seen, he got a new cpap machine. He feels better using cpap. More energy. Less sleepiness. Feels benefit of cpap. His cpap did NOT have an SD card.     Review of Systems  Constitutional: Negative.   HENT: Negative.   Eyes: Negative.   Respiratory: Positive for shortness of breath. Negative for choking, chest tightness, wheezing and stridor.   Cardiovascular: Negative.   Gastrointestinal: Negative.   Endocrine: Negative.   Genitourinary: Negative.   Musculoskeletal: Negative.   Allergic/Immunologic: Negative.   Neurological: Negative.   Hematological: Negative.   Psychiatric/Behavioral: Negative.   All other systems reviewed and are negative.       Past Medical History  Diagnosis Date  . OSA (obstructive sleep apnea)   . Hypertension   . Hypercholesterolemia   . Other abnormal glucose   . Overweight(278.02)   . Diverticulosis of colon   . Hx of colonic polyps   . Abnormal liver function tests   . BPH (benign prostatic hyperplasia)   . DJD (degenerative joint disease)   . Diabetes mellitus without complication ([place])   . Sleep apnea     wears CPAP       Family History  Problem Relation Age of Onset  . Cancer Paternal Uncle   . Colon cancer Neg Hx   . Esophageal cancer Neg Hx   . Rectal cancer Neg Hx   . Stomach cancer Neg Hx      Past Surgical History  Procedure Laterality Date  . Vasectomy      by Dr. Janice Norrie    Social History   Social History  . Marital Status: Married    Spouse Name: carolyn marie Lemme  . Number of Children: 2  . 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     Social History Main Topics  . Smoking status: Former Smoker -- 0.30 packs/day for 10 years    Types: Cigarettes    Quit date: 07/29/1978  . Smokeless tobacco: Never Used  . Alcohol Use: Yes     Comment: social use  . Drug Use: No  . Sexual Activity: Not on file   Other Topics Concern  . Not on file   Social History Narrative     Allergies  Allergen Reactions  . Statins Other (See Comments)    Severe muscle cramps     Outpatient Prescriptions Prior to Visit  Medication Sig Dispense Refill  . aspirin  81 MG EC tablet Take 81 mg by mouth daily.    . cetirizine (ZYRTEC) 10 MG tablet Take 10 mg by mouth daily as needed.     . clotrimazole-betamethasone (LOTRISONE) cream APPLY AS DIRECTED 15 g 1  . colchicine 0.6 MG tablet Take 1 tablet (0.6 mg total) by mouth 2 (two) times daily as needed (for GOUT flares). 60 tablet 0  . glucose blood test strip Test blood once a day as directed. For One Touch Device. 300 each 3  . metFORMIN (GLUCOPHAGE) 1000 MG tablet Take 1 tablet by mouth 2 (two) times daily.    . multivitamin (THERAGRAN) per tablet Take 1 tablet by mouth daily.      . ONE TOUCH LANCETS MISC Use as directed for blood glucose monitoring 600 each 3  . sildenafil (VIAGRA) 100 MG tablet Take 100 mg by mouth daily as needed.      . tamsulosin (FLOMAX) 0.4 MG CAPS capsule Take 1 capsule (0.4 mg total) by mouth at bedtime. 30 capsule 11  . valsartan-hydrochlorothiazide (DIOVAN-HCT) 160-25 MG  per tablet Take 1 tablet by mouth daily.     No facility-administered medications prior to visit.   No orders of the defined types were placed in this encounter.       Objective:   Physical Exam   Vitals:  Filed Vitals:   11/08/15 1617  BP: 118/72  Pulse: 72  Height: 5\' 11"  (1.803 m)  Weight: 229 lb (103.874 kg)  SpO2: 95%    Constitutional/General:  Pleasant, well-nourished, well-developed, not in any distress,  Comfortably seating.  Well kempt  Body mass index is 31.95 kg/(m^2).  HEENT: Pupils equal and reactive to light and accommodation. Anicteric sclerae. Normal nasal mucosa.   No oral  lesions,  mouth clear,  oropharynx clear, no postnasal drip. (-) Oral thrush. No dental caries.  Airway - Mallampati class III-IV  Neck: No masses. Midline trachea. No JVD, (-) LAD. (-) bruits appreciated.  Respiratory/Chest: Grossly normal chest. (-) deformity. (-) Accessory muscle use.  Symmetric expansion. (-) Tenderness on palpation.  Resonant on percussion.  Diminished BS on both lower lung zones. (-) wheezing, crackles, rhonchi (-) egophony  Cardiovascular: Regular rate and  rhythm, heart sounds normal, no murmur or gallops, no peripheral edema  Gastrointestinal:  Normal bowel sounds. Soft, non-tender. No hepatosplenomegaly.  (-) masses.   Musculoskeletal:  Normal muscle tone. Normal gait.   Extremities: Grossly normal. (-) clubbing, cyanosis.  (-) edema  Skin: (-) rash,lesions seen.   Neurological/Psychiatric : alert, oriented to time, place, person. Normal mood and affect         Assessment & Plan:  Obstructive sleep apnea NPSG 2013:  AHI 69/hr, optimal cpap 17cm (pressures greater resulted in increased centrals)  Pt feels benefit of cpap. Feels better. Less sleepiness. Sometimes, machine is NOT delivering enough pressure and is noisy.  He was switched to Medicare in 2016.   DL x 3 mos (08/2015)  AHI 9,  98%. Auto 5-12 DL for 10/2015 83%, AHI 8.  Auto  5-12  Plan: 1. New machine in feb 2017.  2. Cont cpap. Feels benefit of cpap. More energy. Less sleepiness. Feels benefit of cpap.  2. Will try to increase pressure to autocpap 5-15 2/2 elevated AHI. Need 1 month DL after. May be having centrals per history    Obesity Weight reduction   Patient will follow up with me in 1 yr.     Elsie Saas A. Corrie Dandy, MD Pulmonary and  [place] Office: [phone], Fax: [phone]

## 2015-11-08 NOTE — Patient Instructions (Signed)
1. Continue wearing your cpap. 2. We need to get data from your cpap machine.  Return to clinic in 1 yr

## 2015-11-09 NOTE — Assessment & Plan Note (Signed)
Weight reduction 

## 2015-11-14 ENCOUNTER — Encounter: Payer: Self-pay | Admitting: Gastroenterology

## 2015-11-20 ENCOUNTER — Telehealth: Payer: Self-pay | Admitting: Pulmonary Disease

## 2015-11-20 DIAGNOSIS — G4733 Obstructive sleep apnea (adult) (pediatric): Secondary | ICD-10-CM

## 2015-11-20 NOTE — Telephone Encounter (Signed)
Spoke with pt. States that we changed his CPAP pressure from 5-12cm to 5-15cm. The current setting of 5-15 is to high. States that his mask is leaking and he constantly has to readjust his mask. He would like his pressure changed back to 5-12.  AD - please advise. Thanks.

## 2015-11-20 NOTE — Telephone Encounter (Signed)
Ok to change pressure to 5-12 cm h2O.  Thanks.  AD

## 2015-11-20 NOTE — Telephone Encounter (Signed)
Pt is aware that we will change his pressure back to 5-12. Order has been placed. Nothing further was needed.

## 2015-12-04 ENCOUNTER — Encounter: Payer: Self-pay | Admitting: Pulmonary Disease

## 2016-10-29 ENCOUNTER — Encounter: Payer: Self-pay | Admitting: Pulmonary Disease

## 2016-10-31 ENCOUNTER — Ambulatory Visit (INDEPENDENT_AMBULATORY_CARE_PROVIDER_SITE_OTHER): Payer: Medicare Other | Admitting: Pulmonary Disease

## 2016-10-31 ENCOUNTER — Encounter: Payer: Self-pay | Admitting: Pulmonary Disease

## 2016-10-31 DIAGNOSIS — G4733 Obstructive sleep apnea (adult) (pediatric): Secondary | ICD-10-CM

## 2016-10-31 DIAGNOSIS — I499 Cardiac arrhythmia, unspecified: Secondary | ICD-10-CM | POA: Diagnosis not present

## 2016-10-31 NOTE — Patient Instructions (Signed)
  It was a pleasure taking care of you today!  Continue using your CPAP machine.   Please make sure you use your CPAP device everytime you sleep.  We will monitor the usage of your machine per your insurance requirement.  Your insurance company may take the machine from you if you are not using it regularly.   Please clean the mask, tubings, filter, water reservoir with soapy water every week.  Please use distilled water for the water reservoir.   Please call the office or your machine provider (DME company) if you are having issues with the device.   Return to clinic in 1 year  with NP/APP

## 2016-10-31 NOTE — Progress Notes (Signed)
Subjective:    Patient ID: Diane Mochizuki, male    DOB: Sep 21, 1950, 66 y.o.   MRN: [id number]  HPI   This is the case of Tedric Leeth, 66 y.o. Male, who is in the office today for follow up on is OSA. Pt was previously seen by Dr. Gwenette Greet. Last seen in 03/2014. Split night study (07/2011)  AHI 69. Was on CPAP and Bipap. No optimal settings.  On CPAP 10 cm H2O. Last 3 mos --  98% usage. AHI 9.  He was tried on higher cpap settings before but he did NOT tolerate the pressure.  He feels better using the machine. More energy, less sleepiness.  He feels benefit of CPAP machine.  Sometimes, machine is noisy and not delivering enough pressure. Likely machine is malfxning a bit sometimes.   Has not been admitted since last seen here. No new medical dx. No new meds.   ROV (11/08/15) Pt returns to office for f/u on his osa. Since last seen, he got a new cpap machine. He feels better using cpap. More energy. Less sleepiness. Feels benefit of cpap. His cpap did NOT have an SD card.   ROV 10/31/2016 patient returns to the office as follow-up and sleep apnea. Since last seen, we had to change his cpap pressures back to 5-12 cm water as 5-15 was too high. He feels benefit of using CPAP. Download the last month: 83%, AHI 8.5. No issues with CPAP use. Denies dyspnea, chest pain, palpitations. No recent hospitalization.   Review of Systems  Constitutional: Negative.   HENT: Negative.   Eyes: Negative.   Respiratory: Positive for shortness of breath. Negative for choking, chest tightness, wheezing and stridor.   Cardiovascular: Negative.   Gastrointestinal: Negative.   Endocrine: Negative.   Genitourinary: Negative.   Musculoskeletal: Negative.   Allergic/Immunologic: Negative.   Neurological: Negative.   Hematological: Negative.   Psychiatric/Behavioral: Negative.   All other systems reviewed and are negative.      Objective:   Physical Exam   Vitals:  Vitals:   10/31/16 0900  BP: 124/64    Pulse: 74  SpO2: 99%  Weight: 236 lb 3.2 oz (107.1 kg)  Height: 5\' 11"  (1.803 m)    Constitutional/General:  Pleasant, well-nourished, well-developed, not in any distress,  Comfortably seating.  Well kempt  Body mass index is 32.94 kg/m.  HEENT: Pupils equal and reactive to light and accommodation. Anicteric sclerae. Normal nasal mucosa.   No oral  lesions,  mouth clear,  oropharynx clear, no postnasal drip. (-) Oral thrush. No dental caries.  Airway - Mallampati class III-IV  Neck: No masses. Midline trachea. No JVD, (-) LAD. (-) bruits appreciated.  Respiratory/Chest: Grossly normal chest. (-) deformity. (-) Accessory muscle use.  Symmetric expansion. (-) Tenderness on palpation.  Resonant on percussion.  Diminished BS on both lower lung zones. (-) wheezing, crackles, rhonchi (-) egophony  Cardiovascular: irregular rate and  rhythm, heart sounds normal, no murmur or gallops, no peripheral edema  Gastrointestinal:  Normal bowel sounds. Soft, non-tender. No hepatosplenomegaly.  (-) masses.   Musculoskeletal:  Normal muscle tone. Normal gait.   Extremities: Grossly normal. (-) clubbing, cyanosis.  (-) edema  Skin: (-) rash,lesions seen.   Neurological/Psychiatric : alert, oriented to time, place, person. Normal mood and affect         Assessment & Plan:  OSA (obstructive sleep apnea) NPSG 2013:  AHI 69/hr, optimal cpap 17cm (pressures greater resulted in increased centrals) He got a new machine  February 2017. Feels better using CPAP. More energy. Less sleepiness.  Download in 2017: AHI was elevated at 9. We switched his pressures from 5-12 centimeters water to 5-15 centimeters water secondary to elevated AHI. He could not tolerate higher pressures. We had to revert to 5-12 centimeters water.  He feels rested and symptoms improved with the current settings. Download the last month: 83%, AHI still elevated at 8.5.  Plan :  We extensively discussed the  importance of treating OSA and the need to use PAP therapy.   Continue with current cpap settings 5-12 cm water. I suggested with do a PAP NAP with vernon to determine the best pressure for him. He had central apneas during his diagnostic study. He feels current swelling is okay. He wants to hold off on PAP NAP for now.  Keep current settings for now. We may need to revisit this if his heart rate remains an issue (see separate problem)   Patient was instructed to have mask, tubings, filter, reservoir cleaned at least once a week with soapy water.  Patient was instructed to call the office if he/she is having issues with the PAP device.    I advised patient to obtain sufficient amount of sleep --  7 to 8 hours at least in a 24 hr period.  Patient was advised to follow good sleep hygiene.  Patient was advised NOT to engage in activities requiring concentration and/or vigilance if he/she is and  sleepy.  Patient is NOT to drive if he/she is sleepy.   Irregular heart rate During my exam today, he had an irregular heart rate/pulse. By history, he said he had that years ago for which she was given IV meds which resolved the issue. He is currently not symptomatic. I am concerned about atrial fibrillation. Mentioned to him to let his primary care doctor know about it. He needs to be investigated as far as irregular heart rate is concerned.  Patient will follow up in 1 yr.     Elsie Saas A. Corrie Dandy, MD [facility]monary and [place] [phone], Fax: [phone]

## 2016-10-31 NOTE — Assessment & Plan Note (Signed)
During my exam today, he had an irregular heart rate/pulse. By history, he said he had that years ago for which she was given IV meds which resolved the issue. He is currently not symptomatic. I am concerned about atrial fibrillation. Mentioned to him to let his primary care doctor know about it. He needs to be investigated as far as irregular heart rate is concerned.

## 2016-10-31 NOTE — Assessment & Plan Note (Addendum)
NPSG 2013:  AHI 69/hr, optimal cpap 17cm (pressures greater resulted in increased centrals) He got a new machine February 2017. Feels better using CPAP. More energy. Less sleepiness.  Download in 2017: AHI was elevated at 9. We switched his pressures from 5-12 centimeters water to 5-15 centimeters water secondary to elevated AHI. He could not tolerate higher pressures. We had to revert to 5-12 centimeters water.  He feels rested and symptoms improved with the current settings. Download the last month: 83%, AHI still elevated at 8.5.  Plan :  We extensively discussed the importance of treating OSA and the need to use PAP therapy.   Continue with current cpap settings 5-12 cm water. I suggested with do a PAP NAP with vernon to determine the best pressure for him. He had central apneas during his diagnostic study. He feels current swelling is okay. He wants to hold off on PAP NAP for now.  Keep current settings for now. We may need to revisit this if his heart rate remains an issue (see separate problem)   Patient was instructed to have mask, tubings, filter, reservoir cleaned at least once a week with soapy water.  Patient was instructed to call the office if he/she is having issues with the PAP device.    I advised patient to obtain sufficient amount of sleep --  7 to 8 hours at least in a 24 hr period.  Patient was advised to follow good sleep hygiene.  Patient was advised NOT to engage in activities requiring concentration and/or vigilance if he/she is and  sleepy.  Patient is NOT to drive if he/she is sleepy.

## 2017-10-31 ENCOUNTER — Encounter: Payer: Self-pay | Admitting: Adult Health

## 2017-10-31 ENCOUNTER — Ambulatory Visit (INDEPENDENT_AMBULATORY_CARE_PROVIDER_SITE_OTHER): Payer: Medicare Other | Admitting: Adult Health

## 2017-10-31 DIAGNOSIS — G4733 Obstructive sleep apnea (adult) (pediatric): Secondary | ICD-10-CM

## 2017-10-31 NOTE — Assessment & Plan Note (Signed)
Wt loss  

## 2017-10-31 NOTE — Progress Notes (Signed)
@Patient  ID: Levi Lee, male    DOB: 1950-08-12, 67 y.o.   MRN: [id number]  Chief Complaint  Patient presents with  . Follow-up    OSA     Referring provider: Chesley Noon, MD  HPI: 67 year old male followed for obstructive sleep apnea  TEST  NPSG 2013:  AHI 69/hr, optimal cpap 17cm (pressures greater resulted in increased centrals)  10/31/2017 Follow up : OSA  Patient presents for a one year follow-up.  Patient has underlying severe sleep apnea is on CPAP.  Patient says he feels benefit from CPAP with no significant daytime sleepiness.  Patient says he cannot live without his CPAP at bedtime.  Download shows excellent compliance with average usage at 7 hours.  Patient is on auto CPAP 5-12 cm H2O.  AHI is 5.9.  Minimum leaks.  Allergies  Allergen Reactions  . Statins Other (See Comments)    Severe muscle cramps    There is no immunization history for the selected administration types on file for this patient.  Past Medical History:  Diagnosis Date  . Abnormal liver function tests   . BPH (benign prostatic hyperplasia)   . Diabetes mellitus without complication ([place])   . Diverticulosis of colon   . DJD (degenerative joint disease)   . Hx of colonic polyps   . Hypercholesterolemia   . Hypertension   . OSA (obstructive sleep apnea)   . Other abnormal glucose   . Overweight(278.02)   . Sleep apnea    wears CPAP    Tobacco History: Social History   Tobacco Use  Smoking Status Former Smoker  . Packs/day: 0.30  . Years: 10.00  . Pack years: 3.00  . Types: Cigarettes  . Last attempt to quit: 07/29/1978  . Years since quitting: 39.2  Smokeless Tobacco Never Used   Counseling given: Not Answered   Outpatient Encounter Medications as of 10/31/2017  Medication Sig  . aspirin 81 MG EC tablet Take 81 mg by mouth daily.  . cetirizine (ZYRTEC) 10 MG tablet Take 10 mg by mouth daily as needed.   . clotrimazole-betamethasone (LOTRISONE) cream APPLY AS DIRECTED  .  colchicine 0.6 MG tablet Take 1 tablet (0.6 mg total) by mouth 2 (two) times daily as needed (for GOUT flares).  [name] glucose blood test strip Test blood once a day as directed. For One Touch Device.  . metFORMIN (GLUCOPHAGE) 1000 MG tablet Take 1 tablet by mouth 2 (two) times daily.  . multivitamin (THERAGRAN) per tablet Take 1 tablet by mouth daily.    . ONE TOUCH LANCETS MISC Use as directed for blood glucose monitoring  . sildenafil (VIAGRA) 100 MG tablet Take 100 mg by mouth daily as needed.    . tamsulosin (FLOMAX) 0.4 MG CAPS capsule Take 1 capsule (0.4 mg total) by mouth at bedtime.  . valsartan-hydrochlorothiazide (DIOVAN-HCT) 160-25 MG per tablet Take 1 tablet by mouth daily.   No facility-administered encounter medications on file as of 10/31/2017.      Review of Systems  Constitutional:   No  weight loss, night sweats,  Fevers, chills, fatigue, or  lassitude.  HEENT:   No headaches,  Difficulty swallowing,  Tooth/dental problems, or  Sore throat,                No sneezing, itching, ear ache, nasal congestion, post nasal drip,   CV:  No chest pain,  Orthopnea, PND, swelling in lower extremities, anasarca, dizziness, palpitations, syncope.   GI  No heartburn, indigestion,  abdominal pain, nausea, vomiting, diarrhea, change in bowel habits, loss of appetite, bloody stools.   Resp: No shortness of breath with exertion or at rest.  No excess mucus, no productive cough,  No non-productive cough,  No coughing up of blood.  No change in color of mucus.  No wheezing.  No chest wall deformity  Skin: no rash or lesions.  GU: no dysuria, change in color of urine, no urgency or frequency.  No flank pain, no hematuria   MS:  No joint pain or swelling.  No decreased range of motion.  No back pain.    Physical Exam  BP 126/74 (BP Location: Left Arm, Cuff Size: Normal)   Pulse 84   Ht 5\' 11"  (1.803 m)   Wt 223 lb 12.8 oz (101.5 kg)   SpO2 99%   BMI 31.21 kg/m   GEN: A/Ox3; pleasant  , NAD, obese    HEENT:  [place]/AT,   , NOSE-clear, THROAT-clear, no lesions, no postnasal drip or exudate noted. Class 2-3 MP airway   NECK:  Supple w/ fair ROM; no JVD; normal carotid impulses w/o bruits; no thyromegaly or nodules palpated; no lymphadenopathy.    RESP  Clear  P & A; w/o, wheezes/ rales/ or rhonchi. no accessory muscle use, no dullness to percussion  CARD:  RRR, no m/r/g, no peripheral edema, pulses intact, no cyanosis or clubbing.  GI:   Soft & nt; nml bowel sounds; no organomegaly or masses detected.   Musco: Warm bil, no deformities or joint swelling noted.   Neuro: alert, no focal deficits noted.    Skin: Warm, no lesions or rashes    Lab Results:   BNP No results found for: BNP  ProBNP No results found for: PROBNP  Imaging: No results found.   Assessment & Plan:   OSA (obstructive sleep apnea) Well controlled on CPAP   Plan  Patient Instructions  Continue on CPAP at bedtime Keep up the good work Do not drive if sleepy  Work on healthy weight Follow-up in 1 year with Dr. Elsworth Soho and as needed      Obesity Wt loss     Rexene Edison, NP 10/31/2017

## 2017-10-31 NOTE — Patient Instructions (Signed)
Continue on CPAP at bedtime Keep up the good work Do not drive if sleepy  Work on healthy weight Follow-up in 1 year with Dr. Elsworth Soho and as needed

## 2017-10-31 NOTE — Addendum Note (Signed)
Addended by: Parke Poisson E on: 10/31/2017 05:04 PM   Modules accepted: Orders

## 2017-10-31 NOTE — Assessment & Plan Note (Signed)
Well controlled on CPAP   Plan  Patient Instructions  Continue on CPAP at bedtime Keep up the good work Do not drive if sleepy  Work on healthy weight Follow-up in 1 year with Dr. Elsworth Soho and as needed

## 2018-11-04 ENCOUNTER — Ambulatory Visit (INDEPENDENT_AMBULATORY_CARE_PROVIDER_SITE_OTHER): Payer: Medicare Other | Admitting: Acute Care

## 2018-11-04 ENCOUNTER — Other Ambulatory Visit: Payer: Self-pay

## 2018-11-04 ENCOUNTER — Encounter: Payer: Self-pay | Admitting: Acute Care

## 2018-11-04 ENCOUNTER — Ambulatory Visit: Payer: Medicare Other | Admitting: Pulmonary Disease

## 2018-11-04 DIAGNOSIS — Z9989 Dependence on other enabling machines and devices: Secondary | ICD-10-CM

## 2018-11-04 DIAGNOSIS — G4733 Obstructive sleep apnea (adult) (pediatric): Secondary | ICD-10-CM

## 2018-11-04 NOTE — Patient Instructions (Addendum)
It is good to talk with you. Your Down Load shows excellent compliance. Continue on CPAP at bedtime. You appear to be benefiting from the treatment Goal is to wear for at least 6 hours each night for maximal clinical benefit. Continue to work on weight loss, as the link between excess weight  and sleep apnea is well established.  Do not drive if sleepy. Remember to clean mask, tubing, filter, and reservoir once weekly with soapy water.  Follow up with Judson Roch NP of Dr. Elsworth Soho in 6 months  or before as needed.  Please contact office for sooner follow up if symptoms do not improve or worsen or seek emergency care   Continue to self isolate, wash hand frequently, and wear a face mask if you leave your home. Remember to utilize the early hours at grocery stores/ drug stores  for people > 68 years old or any underlying health risks, as the stores are cleaner and less crowded at these times.   Please let us know if we can be of any additional help during this difficult time .

## 2018-11-04 NOTE — Progress Notes (Signed)
Virtual Visit via Telephone Note  I connected with Levi Lee on 11/04/18 at  9:30 AM EDT by telephone and verified that I am speaking with the correct person using two identifiers.   I discussed the limitations, risks, security and privacy concerns of performing an evaluation and management service by telephone and the availability of in person appointments. I also discussed with the patient that there may be a patient responsible charge related to this service. The patient expressed understanding and agreed to proceed.  I  confirmed date of birth and address to authenticate patient  identity. My nurse Shirlee More LPN reviewed medications and ordered any refills required.    History of Present Illness: 68 yo make former smoker quit 1980 followed  Pt. Presents for one year follow up of CPAP therapy. He has underlying OSA which is being treated with CPAP. Patient is on Air Sense Auto Set  CPAP 5-12 cm H2O. I have personally reviewed his down Load. Compliance is excellent: AHI is 5.1.He is followed by Dr. Elsworth Soho.  Pt. States he is doing well on his CPAP. He states he has no issues. He states he has no daytime sleepiness. He denies any morning headaches. He has adequate supplies. We reviewed the need to make sure all equipment is being kept clean during this pandemic. We discussed frequent handwashing and cleaning of mask, tubing, filter, and reservoir once weekly with soapy water, and allowing it to dry completely. He verbalized that he is cleaning his equipment weekly.   We reviewed that he is at increased risk for complications with COVID 19 as he has an underlying pulmonary  condition. We reviewed the need for social distancing and frequent hand washing. We reviewed he should wear a mask if he absolutely has to venture out. ( Encouraged to stay home if possible). He verbalized understanding.   Observations/Objective: NPSG 2013: AHI 69/hr, optimal cpap 17cm (pressures greater resulted in  increased centrals)  CPAP Down Load 10/05/2018-11/03/2018 Auto Set 5-12 cm H2O Usage 29/30 days ( 97%) > 4 hours 29 days or 97% Average usage 7 hours 20 minutes  Median pressure: 8.7 cm H2O AHI 5.1 ( 2 central, 1.9 obstructive)  Excellent compliance and control. Congratulated on his excellent compliance with therapy.  Assessment and Plan: OSA with CPAP Therapy Excellent compliance  AHI is 5.1 Plan: Continue on CPAP at bedtime. You appear to be benefiting from the treatment Goal is to wear for at least 6 hours each night for maximal clinical benefit. Continue to work on weight loss, as the link between excess weight  and sleep apnea is well established.  Do not drive if sleepy. Remember to clean mask, tubing, filter, and reservoir once weekly with soapy water.  Let us know if you need any additional supplies. Follow up with Judson Roch NP or Dr. Elsworth Soho in 6 months or before as needed. Please contact office for sooner follow up if symptoms do not improve or worsen or seek emergency care    Follow Up Instructions:    I discussed the assessment and treatment plan with the patient. The patient was provided an opportunity to ask questions and all were answered. The patient agreed with the plan and demonstrated an understanding of the instructions.   The patient was advised to call back or seek an in-person evaluation if the symptoms worsen or if the condition fails to improve as anticipated.  I provided 25 minutes of non-face-to-face time during this encounter.   Magdalen Spatz, NP 11/04/2018  9:34 AM

## 2018-11-04 NOTE — Addendum Note (Signed)
Addended by: Shirlee More R on: 11/04/2018 10:03 AM   Modules accepted: Orders

## 2019-03-10 ENCOUNTER — Telehealth: Payer: Self-pay | Admitting: Pulmonary Disease

## 2019-03-10 DIAGNOSIS — G4733 Obstructive sleep apnea (adult) (pediatric): Secondary | ICD-10-CM

## 2019-03-10 NOTE — Telephone Encounter (Signed)
Called and spoke w/ [employer] representative [phone], who stated a new order for renewal CPAP supplies needed to be placed and faxed to [phone]. Order was placed 11/04/2018 to [employer], however, specific criteria such as length of need and specific supplies were not included. I let representative know I would place a new order. Representative expressed understanding.   Called and spoke w/ pt to let him know an order has been placed for his CPAP supplies. Pt verbalized understanding with no additional questions. Nothing further needed at this time.

## 2019-08-18 ENCOUNTER — Ambulatory Visit: Payer: Medicare Other | Attending: Internal Medicine

## 2019-08-18 DIAGNOSIS — Z23 Encounter for immunization: Secondary | ICD-10-CM

## 2019-08-18 NOTE — Progress Notes (Signed)
   Covid-19 Vaccination Clinic  Name:  Levi Lee    MRN: [id number] DOB: [birth year]  08/18/2019  [name] was observed post Covid-19 immunization for 15 minutes without incidence. He was provided with Vaccine Information Sheet and instruction to access the V-Safe system.   [name] was instructed to call 911 with any severe reactions post vaccine: [name] Difficulty breathing  . Swelling of your face and throat  . A fast heartbeat  . A bad rash all over your body  . Dizziness and weakness    Immunizations Administered    Name Date Dose VIS Date Route   Pfizer COVID-19 Vaccine 08/18/2019  3:03 PM 0.3 mL 07/09/2019 Intramuscular   Manufacturer: [place]   Lot: [id number]   [place]: [id number]

## 2019-08-22 ENCOUNTER — Ambulatory Visit: Payer: Medicare Other

## 2019-09-06 ENCOUNTER — Ambulatory Visit: Payer: Medicare Other | Attending: Internal Medicine

## 2019-09-06 DIAGNOSIS — Z23 Encounter for immunization: Secondary | ICD-10-CM | POA: Insufficient documentation

## 2019-09-06 NOTE — Progress Notes (Signed)
   Covid-19 Vaccination Clinic  Name:  Levi Lee    MRN: [id number] DOB: Aug 07, 1950  09/06/2019  Levi Lee was observed post Covid-19 immunization for 15 minutes without incidence. He was provided with Vaccine Information Sheet and instruction to access the V-Safe system.   Levi Lee was instructed to call 911 with any severe reactions post vaccine: [name] Difficulty breathing  . Swelling of your face and throat  . A fast heartbeat  . A bad rash all over your body  . Dizziness and weakness    Immunizations Administered    Name Date Dose VIS Date Route   Pfizer COVID-19 Vaccine 09/06/2019  1:40 PM 0.3 mL 07/09/2019 Intramuscular   Manufacturer: [place]   Lot: [id number]   [place]: [id number]

## 2019-09-22 ENCOUNTER — Ambulatory Visit: Payer: Medicare Other | Admitting: Pulmonary Disease

## 2019-10-07 ENCOUNTER — Ambulatory Visit: Payer: Medicare Other | Admitting: Pulmonary Disease

## 2019-10-18 ENCOUNTER — Ambulatory Visit (INDEPENDENT_AMBULATORY_CARE_PROVIDER_SITE_OTHER): Payer: Medicare Other | Admitting: Pulmonary Disease

## 2019-10-18 ENCOUNTER — Other Ambulatory Visit: Payer: Self-pay

## 2019-10-18 ENCOUNTER — Encounter: Payer: Self-pay | Admitting: Pulmonary Disease

## 2019-10-18 DIAGNOSIS — G4733 Obstructive sleep apnea (adult) (pediatric): Secondary | ICD-10-CM

## 2019-10-18 NOTE — Progress Notes (Signed)
   Subjective:    Patient ID: Levi Lee, male    DOB: 07/26/1951, 69 y.o.   MRN: [id number]  HPI  This is my first encounter with this 69 year old African-American man for follow-up of obstructive sleep apnea. He was initially diagnosed by Dr. Gwenette Greet and then saw Dr. Murlean Iba in 2018 and is now establishing with me.  He could not tolerate high pressures on CPAP and has actually had centrals on higher pressures.  Has been maintained on auto CPAP 5 to 12 cm for the last 2 years with good results  He denies any problems with mask or pressure, CPAP has really improved his daytime somnolence and fatigue.  He does not have any problems driving anymore.  Bed partner history-no snoring or witnessed apneas He has obtained Covid shots x2 , He denies palpitations, maintained on Eliquis  PM H-diabetes, hypertension, atrial fibrillation on Eliquis  Significant tests/ events reviewed  NPSG 2013: AHI 69/hr, optimal cpap 17cm (pressures greater resulted in increased centrals)   Past Medical History:  Diagnosis Date  . Abnormal liver function tests   . BPH (benign prostatic hyperplasia)   . Diabetes mellitus without complication ([place])   . Diverticulosis of colon   . DJD (degenerative joint disease)   . Hx of colonic polyps   . Hypercholesterolemia   . Hypertension   . OSA (obstructive sleep apnea)   . Other abnormal glucose   . Overweight(278.02)   . Sleep apnea    wears CPAP     Review of Systems Patient denies significant dyspnea,cough, hemoptysis,  chest pain, palpitations, pedal edema, orthopnea, paroxysmal nocturnal dyspnea, lightheadedness, nausea, vomiting, abdominal or  leg pains      Objective:   Physical Exam  Gen. Pleasant, obese, in no distress ENT - no lesions, no post nasal drip Neck: No JVD, no thyromegaly, no carotid bruits Lungs: no use of accessory muscles, no dullness to percussion, decreased without rales or rhonchi  Cardiovascular: Rhythm irregular, heart sounds   normal, no murmurs or gallops, no peripheral edema Musculoskeletal: No deformities, no cyanosis or clubbing , no tremors       Assessment & Plan:

## 2019-10-18 NOTE — Addendum Note (Signed)
Addended by: Lia Foyer R on: 10/18/2019 09:59 AM   Modules accepted: Orders

## 2019-10-18 NOTE — Assessment & Plan Note (Signed)
CPAP is working well on current auto settings 5-12 .  Maximum pressure is 12 which is controlling events and he is very compliant Trial of AirFit F30 full facemask next time You are due for a mask change CPAP supplies will be renewed for a year  He is at high cardiovascular risk Weight loss encouraged, compliance with goal of at least 4-6 hrs every night is the expectation. Advised against medications with sedative side effects Cautioned against driving when sleepy - understanding that sleepiness will vary on a day to day basis

## 2019-10-18 NOTE — Patient Instructions (Signed)
CPAP is working well on current auto settings Trial of AirFit F30 full facemask next time You are due for a mask change CPAP supplies will be renewed for a year

## 2020-08-22 ENCOUNTER — Other Ambulatory Visit: Payer: Self-pay

## 2020-08-22 ENCOUNTER — Emergency Department (INDEPENDENT_AMBULATORY_CARE_PROVIDER_SITE_OTHER)
Admission: EM | Admit: 2020-08-22 | Discharge: 2020-08-22 | Disposition: A | Discharge status: Home / Self Care | Payer: Medicare Other | Source: Home / Self Care

## 2020-08-22 DIAGNOSIS — R3911 Hesitancy of micturition: Secondary | ICD-10-CM

## 2020-08-22 DIAGNOSIS — I480 Paroxysmal atrial fibrillation: Secondary | ICD-10-CM

## 2020-08-22 DIAGNOSIS — N419 Inflammatory disease of prostate, unspecified: Secondary | ICD-10-CM | POA: Diagnosis not present

## 2020-08-22 LAB — POCT URINALYSIS DIP (MANUAL ENTRY)
Bilirubin, UA: NEGATIVE
Glucose, UA: NEGATIVE mg/dL
Ketones, POC UA: NEGATIVE mg/dL
Leukocytes, UA: NEGATIVE
Nitrite, UA: NEGATIVE
Protein Ur, POC: 100 mg/dL — AB
Spec Grav, UA: >=1.03 — AB (ref 1.010–1.025)
Urobilinogen, UA: 0.2 E.U./dL
pH, UA: 5 (ref 5.0–8.0)

## 2020-08-22 MED ORDER — METOPROLOL TARTRATE 25 MG PO TABS: 12.5000 mg | ORAL_TABLET | Freq: Two times a day (BID) | ORAL | 0 refills | Status: DC

## 2020-08-22 MED ORDER — SULFAMETHOXAZOLE-TRIMETHOPRIM 800-160 MG PO TABS: 1.0000 | ORAL_TABLET | Freq: Two times a day (BID) | ORAL | 0 refills | Status: DC

## 2020-08-22 NOTE — ED Provider Notes (Signed)
[name]    CSN: [id number] Arrival date & time: 08/22/20  1405      History   Chief Complaint Chief Complaint  Patient presents with  . Urinary Frequency  . Abdominal Pain    HPI Levi Lee is a 70 y.o. male.   HPI  Patient with a history of paroxsymal atrial fibrillation, CKD 3, Diabetes, OSA, obesity, BPH  presents to urgent care today for evaluation of dysuria, urinary retention and urinary hesitancy. During triage, RN noted that patient heart rate was erratic and did not measure therefore an EKG was ordered. Patient was subsequently found to be in atrial fibrillation. Patient reports seeing a cardiologist in [facility] system in 2017-2018 and received treatment to covert atrial fib. Since this time PCP has managed patient and refilled medication. He is not rate controlled on anticoagulated.  He denies SOB or chest pain. He endorse daily physical activity include long walks without dyspnea. Cardiac medication include Eliquis and losartan and HCTZ Diabetes controlled last A1C around 7.0. He is followed by [facility] pulmonology for management of OSA and endorses compliance to CPAP.  Urinary complaint:   Past Medical History:  Diagnosis Date  . Abnormal liver function tests   . BPH (benign prostatic hyperplasia)   . Diabetes mellitus without complication ([place])   . Diverticulosis of colon   . DJD (degenerative joint disease)   . Hx of colonic polyps   . Hypercholesterolemia   . Hypertension   . OSA (obstructive sleep apnea)   . Other abnormal glucose   . Overweight(278.02)   . Sleep apnea    wears CPAP    Patient Active Problem List   Diagnosis Date Noted  . Irregular heart rate 10/31/2016  . Obesity 09/06/2015  . Diabetes mellitus ([place]) 04/10/2011  . Physical exam, annual 11/05/2010  . LIVER FUNCTION TESTS, ABNORMAL, HX OF 01/13/2010  . COLONIC POLYPS 02/02/2008  . OVERWEIGHT 02/02/2008  . Obstructive sleep apnea 02/02/2008  . DIVERTICULOSIS OF  COLON 02/02/2008  . DEGENERATIVE JOINT DISEASE 02/02/2008  . HYPERCHOLESTEROLEMIA 01/12/2008  . HYPERTENSION 01/12/2008  . BENIGN PROSTATIC HYPERTROPHY, HX OF 01/12/2008    Past Surgical History:  Procedure Laterality Date  . VASECTOMY     by Dr. Janice Norrie       Home Medications    Prior to Admission medications   Medication Sig Start Date End Date Taking? Authorizing Provider  apixaban (ELIQUIS) 5 MG TABS tablet Take 5 mg by mouth 2 (two) times daily. 08/13/18   [provider]  cetirizine (ZYRTEC) 10 MG tablet Take 10 mg by mouth daily as needed.     [provider]  clotrimazole-betamethasone (LOTRISONE) cream APPLY AS DIRECTED 10/13/13   Noralee Space, MD  colchicine 0.6 MG tablet Take 1 tablet (0.6 mg total) by mouth 2 (two) times daily as needed (for GOUT flares). 10/13/13   Noralee Space, MD  etodolac (LODINE XL) 400 MG 24 hr tablet Take 400 mg by mouth daily. 04/20/18   [provider]  glucose blood test strip Test blood once a day as directed. For One Touch Device. 05/20/13   Noralee Space, MD  metFORMIN (GLUCOPHAGE) 1000 MG tablet Take 1 tablet by mouth 2 (two) times daily. 08/26/15   [provider]  multivitamin [facility]) per tablet Take 1 tablet by mouth daily.      [provider]  ONE TOUCH LANCETS MISC Use as directed for blood glucose monitoring 07/27/12   Noralee Space,  MD  sildenafil (VIAGRA) 100 MG tablet Take 100 mg by mouth daily as needed.      [provider]  tamsulosin (FLOMAX) 0.4 MG CAPS capsule Take 1 capsule (0.4 mg total) by mouth at bedtime. 05/20/13   Noralee Space, MD  valsartan-hydrochlorothiazide (DIOVAN-HCT) 160-25 MG per tablet Take 1 tablet by mouth daily.    [provider]    Family History Family History  Problem Relation Age of Onset  . Cancer Paternal Uncle   . Colon cancer Neg Hx   . Esophageal cancer Neg Hx   . Rectal cancer Neg Hx   . Stomach cancer Neg Hx     Social  History Social History   Tobacco Use  . Smoking status: Former Smoker    Packs/day: 0.30    Years: 10.00    Pack years: 3.00    Types: Cigarettes    Quit date: 07/29/1978    Years since quitting: 42.0  . Smokeless tobacco: Never Used  Vaping Use  . Vaping Use: Never used  Substance Use Topics  . Alcohol use: Yes    Comment: social use  . Drug use: No     Allergies   Statins  Review of Systems Review of Systems Pertinent negatives listed in HPI  Physical Exam Triage Vital Signs ED Triage Vitals  Enc Vitals Group     BP 08/22/20 1439 134/85     Pulse Rate 08/22/20 1439 96     Resp 08/22/20 1439 16     Temp 08/22/20 1439 97.9 F (36.6 C)     Temp Source 08/22/20 1439 Oral     SpO2 08/22/20 1439 98 %     Weight --      Height --      Head Circumference --      Peak Flow --      Pain Score 08/22/20 1437 4     Pain Loc --      Pain Edu? --      Excl. in [place]? --    No data found.  Updated Vital Signs BP 134/85 (BP Location: Right Arm)   Pulse 96   Temp 97.9 F (36.6 C) (Oral)   Resp 16   SpO2 98%   Visual Acuity Right Eye Distance:   Left Eye Distance:   Bilateral Distance:    Right Eye Near:   Left Eye Near:    Bilateral Near:     Physical Exam Constitutional:      General: He is not in acute distress.    Appearance: He is obese. He is not ill-appearing.  HENT:     Head: Normocephalic.  Cardiovascular:     Rate and Rhythm: Tachycardia present. Rhythm irregular.  Pulmonary:     Effort: Pulmonary effort is normal.     Breath sounds: Normal breath sounds. No wheezing or rhonchi.  Abdominal:     General: Abdomen is protuberant. Bowel sounds are normal. There is distension.     Palpations: Abdomen is rigid.     Comments: Suprapubic distension no appreciable lower abdomen tenderness with palpation   Genitourinary:    Comments: Given hx deferred rectal exam  Skin:    General: Skin is warm and dry.  Neurological:     General: No focal deficit  present.     Mental Status: He is oriented to person, place, and time.     Motor: No weakness.  Psychiatric:        Mood and Affect:  Mood normal.        Behavior: Behavior normal.      UC Treatments / Results  Labs (all labs ordered are listed, but only abnormal results are displayed) Labs Reviewed  POCT URINALYSIS DIP (MANUAL ENTRY) - Abnormal; Notable for the following components:      Result Value   Color, UA straw (*)    Clarity, UA turbid (*)    Spec Grav, UA >=1.030 (*)    Blood, UA large (*)    Protein Ur, POC =100 (*)    All other components within normal limits  URINE CULTURE    EKG Atrial Fibrillation, PR (not measurable), VR 117  Radiology No results found.  Procedures Procedures (including critical care time)  Medications Ordered in UC Medications - No data to display  Initial Impression / Assessment and Plan / UC Course  I have reviewed the triage vital signs and the nursing notes.  Pertinent labs & imaging results that were available during my care of the patient were reviewed by me and considered in my medical decision making (see chart for details).     Atrial fibrillation, Paroxsymal, rate 117, referring emergently to cardiology. Given rate persistently 110-117, will start patient on low dose BB for rate control until seen by cardiology. Spoke with UC medical director, given patient is asymptomatic with known history and anticoagulated, ok refer to cardiology emergently. Discussed with patient he is in agreement with plan.  Prostatitis, urine culture pending. Start Bactrim BID daily x 10 day. PCP follow-up scheduled in 2 weeks, keep appointment and have urine rechecked. Urology referral if symptoms persists. Patient verbalized understanding and agreement with plan. Final Clinical Impressions(s) / UC Diagnoses   Final diagnoses:  Paroxysmal atrial fibrillation (HCC)  Prostatitis, unspecified prostatitis type     Discharge Instructions     For  atrial fibrillation start metoprolol 12.5 mg twice daily continue with medication until otherwise directed after your visit with cardiology. Continue Eliquis as prescribed.  Cardiology will contact you to schedule your appointment I have placed a referral as an urgent referral.  In the meantime if you experiencing any shortness of breath or chest pressure chest pain I would like for you to go immediately to the emergency department for further work-up and evaluation.  For infection of the prostate I am starting you on Bactrim take 1 tablet twice daily over the course of the next 10 days.  Continue Flomax as prescribed.    ED Prescriptions    Medication Sig Dispense Auth. Provider   sulfamethoxazole-trimethoprim (BACTRIM DS) 800-160 MG tablet Take 1 tablet by mouth 2 (two) times daily. 20 tablet Scot Jun, FNP   metoprolol tartrate (LOPRESSOR) 25 MG tablet Take 0.5 tablets (12.5 mg total) by mouth 2 (two) times daily. 30 tablet Scot Jun, FNP     PDMP not reviewed this encounter.   Scot Jun, FNP 08/22/20 2210

## 2020-08-22 NOTE — ED Triage Notes (Signed)
Patient presents to Urgent Care with complaints of urinary hesitancy and frequency since the past few days. Patient reports he also has some lower abdominal pain. Pt thinks this might be caused by a new cholesterol medication he just started (otc CholestOff Plus).

## 2020-08-22 NOTE — Discharge Instructions (Addendum)
For atrial fibrillation start metoprolol 12.5 mg twice daily continue with medication until otherwise directed after your visit with cardiology. Continue Eliquis as prescribed.  Cardiology will contact you to schedule your appointment I have placed a referral as an urgent referral.  In the meantime if you experiencing any shortness of breath or chest pressure chest pain I would like for you to go immediately to the emergency department for further work-up and evaluation.  For infection of the prostate I am starting you on Bactrim take 1 tablet twice daily over the course of the next 10 days.  Continue Flomax as prescribed.

## 2020-08-23 ENCOUNTER — Encounter: Payer: Self-pay | Admitting: Cardiovascular Disease

## 2020-08-23 ENCOUNTER — Ambulatory Visit (INDEPENDENT_AMBULATORY_CARE_PROVIDER_SITE_OTHER): Payer: Medicare Other | Admitting: Cardiovascular Disease

## 2020-08-23 VITALS — BP 110/64 | HR 86 | Ht 71.0 in | Wt 225.0 lb

## 2020-08-23 DIAGNOSIS — E7800 Pure hypercholesterolemia, unspecified: Secondary | ICD-10-CM | POA: Diagnosis not present

## 2020-08-23 DIAGNOSIS — I480 Paroxysmal atrial fibrillation: Secondary | ICD-10-CM | POA: Diagnosis not present

## 2020-08-23 DIAGNOSIS — G4733 Obstructive sleep apnea (adult) (pediatric): Secondary | ICD-10-CM

## 2020-08-23 DIAGNOSIS — E1169 Type 2 diabetes mellitus with other specified complication: Secondary | ICD-10-CM

## 2020-08-23 DIAGNOSIS — E669 Obesity, unspecified: Secondary | ICD-10-CM | POA: Insufficient documentation

## 2020-08-23 DIAGNOSIS — I10 Essential (primary) hypertension: Secondary | ICD-10-CM

## 2020-08-23 DIAGNOSIS — I4819 Other persistent atrial fibrillation: Secondary | ICD-10-CM

## 2020-08-23 HISTORY — DX: Type 2 diabetes mellitus with other specified complication: E11.69

## 2020-08-23 HISTORY — DX: Other persistent atrial fibrillation: I48.19

## 2020-08-23 NOTE — Progress Notes (Signed)
.  Cardiology Office Note   Date:  08/23/2020   ID:  Levi Lee, DOB 10-09-1950, MRN [id number]  PCP:  Chesley Noon, MD  Cardiologist:   Skeet Latch, MD   Chief Complaint  Patient presents with  . Follow-up    Follow-up     History of Present Illness: Levi Lee is a 70 y.o. male taking with paroxysmal atrial fibrillation, diabetes, hypertension, CKD 3, hyperlipidemia, and OSA on CPAP who is being seen today for the evaluation of tachycardia at the request of Chesley Noon, MD.  Levi Lee was seen in the ER 08/22/20 with urinary retention.  He was found to have prostatitis.  He notes that he started taking a new OTC cholesterol medication a couple days prior.  EKG was consistent with atrial fibrillation at a rate of 117 bpm.  Metoprolol was added to his regimen.  He was started on Bactrim and started feeling relief in his suprapubic discomfort within a couple hours.  He denies any tachycardia.  He is not have any lightheadedness or dizziness.  He was advised to start taking metoprolol.  Levi Lee has a history of atrial fibrillation previously treated by cardiologist at [facility] in 2017.  He has been on Eliquis since that time.  He has not required rate control.  When he turns his blood pressure at home is typically well controlled.  His heart rate is usually less than 110.  He had a stress test at [facility] in 2017.  He was found to have LVEF 64% with no ischemia.  He also had an echo in 2018, but the report is not available.  He also reports an episode of heart racing approximately 25 years ago that required medications has not had any types of symptoms.  Levi Lee has a history of hyperlipidemia but has not tolerated statins.  He recalls trying atorvastatin, rosuvastatin, and pravastatin.  He thinks he may have tried others as well.  He is active and walks on a treadmill for a couple times per week.  He walks for about 3 miles.  He also does outside work including shoveling snow and  washing his cars.  He has no exertional chest pain or shortness of breath.  He denies lower extremity edema, orthopnea, or PND.  Overall he has been well.  He uses his CPAP regularly.   Past Medical History:  Diagnosis Date  . Abnormal liver function tests   . BPH (benign prostatic hyperplasia)   . Diabetes mellitus type 2 in obese ([place]) 08/23/2020  . Diabetes mellitus without complication ([place])   . Diverticulosis of colon   . DJD (degenerative joint disease)   . Hx of colonic polyps   . Hypercholesterolemia   . Hypertension   . OSA (obstructive sleep apnea)   . Other abnormal glucose   . Overweight(278.02)   . Persistent atrial fibrillation ([place]) 08/23/2020  . Sleep apnea    wears CPAP    Past Surgical History:  Procedure Laterality Date  . VASECTOMY     by Dr. Janice Norrie     Current Outpatient Medications  Medication Sig Dispense Refill  . apixaban (ELIQUIS) 5 MG TABS tablet Take 5 mg by mouth 2 (two) times daily.    . cetirizine (ZYRTEC) 10 MG tablet Take 10 mg by mouth daily as needed.    . clotrimazole-betamethasone (LOTRISONE) cream APPLY AS DIRECTED 15 g 1  . colchicine 0.6 MG tablet Take 1 tablet (0.6 mg total) by mouth 2 (two) times  daily as needed (for GOUT flares). 60 tablet 0  . etodolac (LODINE XL) 400 MG 24 hr tablet Take 400 mg by mouth daily.    [name] glucose blood test strip Test blood once a day as directed. For One Touch Device. 300 each 3  . ibuprofen (ADVIL) 800 MG tablet Take by mouth.    . metFORMIN (GLUCOPHAGE) 1000 MG tablet Take 1 tablet by mouth 2 (two) times daily.    . multivitamin (THERAGRAN) per tablet Take 1 tablet by mouth daily.    . ONE TOUCH LANCETS MISC Use as directed for blood glucose monitoring 600 each 3  . sulfamethoxazole-trimethoprim (BACTRIM DS) 800-160 MG tablet Take 1 tablet by mouth 2 (two) times daily. 20 tablet 0  . tamsulosin (FLOMAX) 0.4 MG CAPS capsule Take 1 capsule (0.4 mg total) by mouth at bedtime. 30 capsule 11  .  valsartan-hydrochlorothiazide (DIOVAN-HCT) 160-25 MG per tablet Take 1 tablet by mouth daily.    . sildenafil (VIAGRA) 100 MG tablet Take 100 mg by mouth daily as needed.   (Patient not taking: Reported on 08/23/2020)     No current facility-administered medications for this visit.    Allergies:   Statins    Social History:  The patient  reports that he quit smoking about 42 years ago. His smoking use included cigarettes. He has a 3.00 pack-year smoking history. He has never used smokeless tobacco. He reports current alcohol use. He reports that he does not use drugs.   Family History:  The patient's family history includes Cancer in his paternal uncle.    ROS:  Please see the history of present illness.   Otherwise, review of systems are positive for none.   All other systems are reviewed and negative.    PHYSICAL EXAM: VS:  BP 110/64   Pulse 86   Ht 5\' 11"  (1.803 m)   Wt 225 lb (102.1 kg)   SpO2 96%   BMI 31.38 kg/m  , BMI Body mass index is 31.38 kg/m. GENERAL:  Well appearing HEENT:  Pupils equal round and reactive, fundi not visualized, oral mucosa unremarkable NECK:  No jugular venous distention, waveform within normal limits, carotid upstroke brisk and symmetric, no bruits LUNGS:  Clear to auscultation bilaterally HEART: Irregularly irregular.  PMI not displaced or sustained,S1 and S2 within normal limits, no S3, no S4, no clicks, no rubs, no murmurs ABD:  Flat, positive bowel sounds normal in frequency in pitch, no bruits, no rebound, no guarding, no midline pulsatile mass, no hepatomegaly, no splenomegaly EXT:  2 plus pulses throughout, no edema, no cyanosis no clubbing SKIN:  No rashes no nodules NEURO:  Cranial nerves II through XII grossly intact, motor grossly intact throughout PSYCH:  Cognitively intact, oriented to person place and time  EKG:  EKG is not ordered today. The ekg ordered 08/23/2019 demonstrates atrial fibrillation.  Rate 170 bpm.   Recent Labs: No  results found for requested labs within last 8760 hours.   05/31/2020: Sodium 138, potassium 4.5, BUN 25, creatinine 1.58 Total cholesterol 222, triglycerides 115, HDL 45, LDL 156 Hemoglobin A1c 7.3%   Lipid Panel    Component Value Date/Time   CHOL 195 01/13/2013 0752   TRIG 261.0 (H) 01/13/2013 0752   HDL 37.60 (L) 01/13/2013 0752   CHOLHDL 5 01/13/2013 0752   VLDL 52.2 (H) 01/13/2013 0752   LDLCALC 127 (H) 11/12/2011 0748   LDLDIRECT 114.8 01/13/2013 0752      Wt Readings from Last 3 Encounters:  08/23/20 225 lb (102.1 kg)  10/18/19 219 lb 12.8 oz (99.7 kg)  10/31/17 223 lb 12.8 oz (101.5 kg)      ASSESSMENT AND PLAN:  # Persistent atrial fibrillation:  Levi Lee remains in atrial fibrillation.  I suspect this is generally well-controlled but his heart rate has been higher because of his acute illness.  Of asked him to track his blood pressure and heart rate at home.  He is going to stop taking metoprolol.  If his resting heart rate is persistently greater than 100.  We will also check a CMP, magnesium, TSH, and CBC.  Continue Eliquis.  # Hyperlipidemia: # Statin intolerance:  Levi Lee has not tolerated several statins.  He is at high risk of cardiovascular events given his diabetes, age, obesity, and untreated hyperlipidemia.  We will get a coronary calcium score to better assess his plaque burden.  Based on these findings consider a PCSK9 inhibitor.  # Hypotension:  # Essential hypertension:   Asymptomatic.  Blood pressure was low today after adding metoprolol to his regimen.  Stop metoprolol as above.  Continue valsartan/HCTZ.  # OSA:  Continue CPAP.  # Diabetes:  # CKD III: A1c is 7.5%.  He will continue working with Dr. Melford Aase.  Continue metformin and consider Wilder Glade given his CKD.  Check CMP as above.   Current medicines are reviewed at length with the patient today.  The patient does not have concerns regarding medicines.  The following changes have been  made:  Stop metformin.  Labs/ tests ordered today include:   Orders Placed This Encounter  Procedures  . CT CARDIAC SCORING (SELF PAY ONLY)  . TSH  . Comprehensive metabolic panel  . CBC with Differential/Platelet  . Magnesium     Disposition:   FU with Madyx Delfin C. Oval Linsey, MD, [facility] in 2 months and 1 year.     Signed, Kharizma Lesnick C. Oval Linsey, MD, [facility]  08/23/2020 5:18 PM    [facility]

## 2020-08-23 NOTE — Patient Instructions (Addendum)
Medication Instructions:  STOP METOPROLOL   *If you need a refill on your cardiac medications before your next appointment, please call your pharmacy*  Lab Work: CMET/CBC/TSH/MAGNESIUM SOON   Testing/Procedures: CALCIUM SCORE -- THIS WILL COST $99 OUT OF POCKET  Follow-Up: At [facility], you and your health needs are our priority.  As part of our continuing mission to provide you with exceptional heart care, we have created designated Provider Care Teams.  These Care Teams include your primary Cardiologist (physician) and Advanced Practice Providers (APPs -  Physician Assistants and Nurse Practitioners) who all work together to provide you with the care you need, when you need it.  We recommend signing up for the patient portal called "MyChart".  Sign up information is provided on this After Visit Summary.  MyChart is used to connect with patients for Virtual Visits (Telemedicine).  Patients are able to view lab/test results, encounter notes, upcoming appointments, etc.  Non-urgent messages can be sent to your provider as well.   To learn more about what you can do with MyChart, go to [email or web address].    Your next appointment:   2 month(s)  The format for your next appointment:   In Person  Provider:   You may see DR [facility]  or one of the following Advanced Practice Providers on your designated Care Team:    Kerin Ransom, PA-C  [place], PA-C  Coletta Memos, FNP  IF YOU ARE DOING OK IN 2 MONTHS CALL TO CANCEL AND WILL SEE YOU BACK IN A YEAR  Other Instructions  MONITOR YOUR BLOOD PRESSURE AND HEART RATE AT HOME. IF YOUR HEART RATE IS NOT STAYING BETWEEN 60-100 CALL THE OFFICE

## 2020-08-24 LAB — URINE CULTURE
MICRO NUMBER:: 11457151
Result:: NO GROWTH
SPECIMEN QUALITY:: ADEQUATE

## 2020-08-25 ENCOUNTER — Ambulatory Visit (INDEPENDENT_AMBULATORY_CARE_PROVIDER_SITE_OTHER)
Admission: RE | Admit: 2020-08-25 | Discharge: 2020-08-25 | Disposition: A | Discharge status: Home / Self Care | Payer: Self-pay | Source: Ambulatory Visit | Attending: Cardiovascular Disease | Admitting: Cardiovascular Disease

## 2020-08-25 ENCOUNTER — Other Ambulatory Visit: Payer: Self-pay

## 2020-08-25 DIAGNOSIS — E7800 Pure hypercholesterolemia, unspecified: Secondary | ICD-10-CM

## 2020-09-06 LAB — CBC WITH DIFFERENTIAL/PLATELET
Basophils Absolute: 0.1 10*3/uL (ref 0.0–0.2)
Basos: 2 %
EOS (ABSOLUTE): 0 10*3/uL (ref 0.0–0.4)
Eos: 1 %
Hematocrit: 44.1 % (ref 37.5–51.0)
Hemoglobin: 14.8 g/dL (ref 13.0–17.7)
Immature Grans (Abs): 0 10*3/uL (ref 0.0–0.1)
Immature Granulocytes: 0 %
Lymphocytes Absolute: 1.6 10*3/uL (ref 0.7–3.1)
Lymphs: 48 %
MCH: 33 pg (ref 26.6–33.0)
MCHC: 33.6 g/dL (ref 31.5–35.7)
MCV: 98 fL — ABNORMAL HIGH (ref 79–97)
Monocytes Absolute: 0.5 10*3/uL (ref 0.1–0.9)
Monocytes: 14 %
Neutrophils Absolute: 1.2 10*3/uL — ABNORMAL LOW (ref 1.4–7.0)
Neutrophils: 35 %
Platelets: 143 10*3/uL — ABNORMAL LOW (ref 150–450)
RBC: 4.48 x10E6/uL (ref 4.14–5.80)
RDW: 13.1 % (ref 11.6–15.4)
WBC: 3.3 10*3/uL — ABNORMAL LOW (ref 3.4–10.8)

## 2020-09-06 LAB — COMPREHENSIVE METABOLIC PANEL
ALT: 57 IU/L — ABNORMAL HIGH (ref 0–44)
AST: 33 IU/L (ref 0–40)
Albumin/Globulin Ratio: 1.5 (ref 1.2–2.2)
Albumin: 4.6 g/dL (ref 3.7–4.7)
Alkaline Phosphatase: 89 IU/L (ref 44–121)
BUN/Creatinine Ratio: 22 (ref 10–24)
BUN: 34 mg/dL — ABNORMAL HIGH (ref 8–27)
Bilirubin Total: 0.8 mg/dL (ref 0.0–1.2)
CO2: 23 mmol/L (ref 20–29)
Calcium: 10.1 mg/dL (ref 8.6–10.2)
Chloride: 98 mmol/L (ref 96–106)
Creatinine, Ser: 1.54 mg/dL — ABNORMAL HIGH (ref 0.76–1.27)
GFR calc Af Amer: 52 mL/min/{1.73_m2} — ABNORMAL LOW (ref >59)
GFR calc non Af Amer: 45 mL/min/{1.73_m2} — ABNORMAL LOW (ref >59)
Globulin, Total: 3 g/dL (ref 1.5–4.5)
Glucose: 178 mg/dL — ABNORMAL HIGH (ref 65–99)
Potassium: 4.8 mmol/L (ref 3.5–5.2)
Sodium: 137 mmol/L (ref 134–144)
Total Protein: 7.6 g/dL (ref 6.0–8.5)

## 2020-09-06 LAB — MAGNESIUM: Magnesium: 2.3 mg/dL (ref 1.6–2.3)

## 2020-09-06 LAB — TSH: TSH: 2.08 u[IU]/mL (ref 0.450–4.500)

## 2020-09-15 ENCOUNTER — Ambulatory Visit (INDEPENDENT_AMBULATORY_CARE_PROVIDER_SITE_OTHER): Payer: Medicare Other | Admitting: Pharmacist

## 2020-09-15 ENCOUNTER — Encounter: Payer: Self-pay | Admitting: Pharmacist

## 2020-09-15 ENCOUNTER — Other Ambulatory Visit: Payer: Self-pay

## 2020-09-15 VITALS — Ht 71.0 in | Wt 220.4 lb

## 2020-09-15 DIAGNOSIS — R7989 Other specified abnormal findings of blood chemistry: Secondary | ICD-10-CM | POA: Diagnosis not present

## 2020-09-15 DIAGNOSIS — E7800 Pure hypercholesterolemia, unspecified: Secondary | ICD-10-CM | POA: Diagnosis not present

## 2020-09-15 DIAGNOSIS — T466X5A Adverse effect of antihyperlipidemic and antiarteriosclerotic drugs, initial encounter: Secondary | ICD-10-CM

## 2020-09-15 DIAGNOSIS — G720 Drug-induced myopathy: Secondary | ICD-10-CM

## 2020-09-15 MED ORDER — REPATHA SURECLICK 140 MG/ML ~~LOC~~ SOAJ: 140.0000 mg | SUBCUTANEOUS | 0 refills | Status: DC

## 2020-09-15 NOTE — Patient Instructions (Addendum)
Your Results:             Your most recent labs Goal  Total Cholesterol 197 < 200  Triglycerides 123 < 150  HDL (happy/good cholesterol) 35 > 40  LDL (lousy/bad cholesterol 140 < 70      Medication changes: *START Repatha SureClick 140mg  every 14 days*  Lab orders: *Plan to repeat fasting blood work after 4 doses of [facility] phone number:  [phone] Helon Wisinski/Kristin/Haleigh  Patient Assistance:  [email or web address] for co-pay card   Thank you for choosing [facility]

## 2020-09-15 NOTE — Progress Notes (Signed)
Patient ID: Levi Lee                 DOB: July 03, 1951                    MRN: [id number]     HPI: Levi Lee is a 70 y.o. male patient referred to lipid clinic by Dr Oval Linsey. PMH is significant for diabetes, DJD, hypercholesterolemia, hypertension, OSA, atrial fibrillation, fatty liver, CAD with coronary calcification in the LAD and LCX territories on cardiac CT, and intolerance to multiple statins. Patient recalls trial with atorvastatin, rosuvastatin, and pravastatin. I was able to find trail with pitavastatin as well.   Per Dr Diamantina Monks (PCP at [facility]): "Patient also for follow-up of his hyperlipidemia. He is not been able to get the [place] approved by his insurance company and we tried over and over again to get this approved. Therefore is not taking anything currently as he is intolerant to statins."  Current Medications: none  Intolerances:  Atorvastatin 80mg  daily - cramp leg, thigh and chest Rosuvastatin 20mg  daily - cramp leg, thigh and chest Pitavastatin 1mg  daily - cramp leg, thigh and chest  LDL goal: < 70 mg/dL  Diet: cutting back in carbs, and alcohol, low in fried food, beef and chicken,   Exercise: Patient is active and walks on a treadmill for a couple times per week.  He walks for about 3 miles.  He also does outside work including shoveling snow and washing his cars. [employer]  Family History: patient's family history includes Cancer in his paternal uncle. father and mother have diabetes  Social History: reports that he quit smoking about 42 years ago. His smoking use included cigarettes. He has a 3.00 pack-year smoking history. He has never used smokeless tobacco. He reports current alcohol use. He reports that he does not use drugs.   Labs: 08/30/2020: CHO 197, TG 123, HDL 35, LDL 140 (no therapy)  Past Medical History:  Diagnosis Date  . Abnormal liver function tests   . BPH (benign prostatic hyperplasia)   . Diabetes mellitus type 2 in obese ([place]) 08/23/2020  .  Diabetes mellitus without complication ([place])   . Diverticulosis of colon   . DJD (degenerative joint disease)   . Hx of colonic polyps   . Hypercholesterolemia   . Hypertension   . OSA (obstructive sleep apnea)   . Other abnormal glucose   . Overweight(278.02)   . Persistent atrial fibrillation ([place]) 08/23/2020  . Sleep apnea    wears CPAP    Current Outpatient Medications on File Prior to Visit  Medication Sig Dispense Refill  . apixaban (ELIQUIS) 5 MG TABS tablet Take 5 mg by mouth 2 (two) times daily.    . cetirizine (ZYRTEC) 10 MG tablet Take 10 mg by mouth daily as needed.    . clotrimazole-betamethasone (LOTRISONE) cream APPLY AS DIRECTED 15 g 1  . colchicine 0.6 MG tablet Take 1 tablet (0.6 mg total) by mouth 2 (two) times daily as needed (for GOUT flares). 60 tablet 0  . etodolac (LODINE XL) 400 MG 24 hr tablet Take 400 mg by mouth daily.    [name] glucose blood test strip Test blood once a day as directed. For One Touch Device. 300 each 3  . ibuprofen (ADVIL) 800 MG tablet Take by mouth.    . metFORMIN (GLUCOPHAGE) 1000 MG tablet Take 1 tablet by mouth 2 (two) times daily.    . multivitamin (THERAGRAN) per tablet Take 1 tablet  by mouth daily.    . ONE TOUCH LANCETS MISC Use as directed for blood glucose monitoring 600 each 3  . sildenafil (VIAGRA) 100 MG tablet Take 100 mg by mouth daily as needed.   (Patient not taking: Reported on 08/23/2020)    . sulfamethoxazole-trimethoprim (BACTRIM DS) 800-160 MG tablet Take 1 tablet by mouth 2 (two) times daily. 20 tablet 0  . tamsulosin (FLOMAX) 0.4 MG CAPS capsule Take 1 capsule (0.4 mg total) by mouth at bedtime. 30 capsule 11  . valsartan-hydrochlorothiazide (DIOVAN-HCT) 160-25 MG per tablet Take 1 tablet by mouth daily.     No current facility-administered medications on file prior to visit.    Allergies  Allergen Reactions  . Statins Other (See Comments)    Severe muscle cramps    HYPERCHOLESTEROLEMIA LDL above goal for  secondary prevention on patient with CAD (per cardiac CT), diabetes, and fatty liver. Noted he failed trial with more then 4 statins, and will like to avoid medication that can increase LFT.   We discussed Repatha/Pralkuent and inclisiran therapy. Patient is willing to start [facility] therapy. Storage, administration, monitoring, co-pay assistance, common side effects, and prior-authorization process explained to patient.   Repatha SureClick sample provided for self-administration. Will follow up as needed, and repeat fasting lipid panel after 4th dose of new medication.  [name] PharmD, BCPS, CPP [facility] [id number] 09/15/2020 3:45 PM

## 2020-09-15 NOTE — Assessment & Plan Note (Signed)
LDL above goal for secondary prevention on patient with CAD (per cardiac CT), diabetes, and fatty liver. Noted he failed trial with more then 4 statins, and will like to avoid medication that can increase LFT.   We discussed Repatha/Pralkuent and inclisiran therapy. Patient is willing to start [facility] therapy. Storage, administration, monitoring, co-pay assistance, common side effects, and prior-authorization process explained to patient.   Repatha SureClick sample provided for self-administration. Will follow up as needed, and repeat fasting lipid panel after 4th dose of new medication.

## 2020-09-25 ENCOUNTER — Telehealth: Payer: Self-pay

## 2020-09-25 ENCOUNTER — Other Ambulatory Visit: Payer: Self-pay

## 2020-09-25 DIAGNOSIS — E7800 Pure hypercholesterolemia, unspecified: Secondary | ICD-10-CM

## 2020-09-25 MED ORDER — REPATHA SURECLICK 140 MG/ML ~~LOC~~ SOAJ: 140.0000 mg | SUBCUTANEOUS | 11 refills | Status: DC

## 2020-09-25 NOTE — Telephone Encounter (Signed)
lmomed the pt to start repatha 140, rx sent, pt instructed to complete fasting labs post 4th dose and to call if unaffordable.

## 2020-10-19 ENCOUNTER — Ambulatory Visit (INDEPENDENT_AMBULATORY_CARE_PROVIDER_SITE_OTHER): Payer: Medicare Other | Admitting: Adult Health

## 2020-10-19 ENCOUNTER — Other Ambulatory Visit: Payer: Self-pay

## 2020-10-19 ENCOUNTER — Encounter: Payer: Self-pay | Admitting: Adult Health

## 2020-10-19 DIAGNOSIS — G4733 Obstructive sleep apnea (adult) (pediatric): Secondary | ICD-10-CM | POA: Diagnosis not present

## 2020-10-19 DIAGNOSIS — E663 Overweight: Secondary | ICD-10-CM

## 2020-10-19 NOTE — Patient Instructions (Addendum)
Order for new CPAP  Machine .  Continue on CPAP at bedtime Keep up the good work Do not drive if sleepy  Work on healthy weight Follow-up in 1 year with Dr. Elsworth Soho and as needed

## 2020-10-19 NOTE — Progress Notes (Signed)
@Patient  ID: Levi Lee, male    DOB: Dec 09, 1950, 70 y.o.   MRN: [id number]  Chief Complaint  Patient presents with  . Follow-up    Referring provider: Chesley Noon, MD  HPI: 70 year old male followed for obstructive sleep apnea Unable to tolerate high CPAP pressures, with emergence of central apneic events  Medical history significant for A. fib on chronic anticoagulation with Eliquis  TEST/EVENTS :  NPSG 2013: AHI 69/hr, optimal cpap 17cm (pressures greater resulted in increased centrals)  10/19/2020 Follow up : OSA  Patient returns for a 1 year follow-up.  Patient has underlying severe sleep apnea.  Is on nocturnal CPAP.  Patient says he is doing well on CPAP.  He feels rested with no significant daytime sleepiness.  Feels that he benefits from CPAP.  CPAP download shows excellent compliance with 100% usage.  Daily average usage at 8 hours.  Patient is on auto CPAP 5 to 12 cm H2O.  Average daily pressure at 11 cm H2O.  AHI 6.8.  (4.2 centrals) minimal leaks. Says his machine is getting old. Wants a new machine, says he can not risk going without his CPAP .  Remains active, fishing , golf . Has his own buisness-cabinets and countertops.  Does his own yardwork.    Allergies  Allergen Reactions  . Statins Other (See Comments)    Severe muscle cramps    Immunization History  Administered Date(s) Administered  . Influenza, High Dose Seasonal PF 04/30/2017, 05/29/2018  . PFIZER(Purple Top)SARS-COV-2 Vaccination 08/18/2019, 09/06/2019  . Typhoid Live 01/06/2017    Past Medical History:  Diagnosis Date  . Abnormal liver function tests   . BPH (benign prostatic hyperplasia)   . Diabetes mellitus type 2 in obese ([place]) 08/23/2020  . Diabetes mellitus without complication ([place])   . Diverticulosis of colon   . DJD (degenerative joint disease)   . Hx of colonic polyps   . Hypercholesterolemia   . Hypertension   . OSA (obstructive sleep apnea)   . Other abnormal glucose    . Overweight(278.02)   . Persistent atrial fibrillation ([place]) 08/23/2020  . Sleep apnea    wears CPAP    Tobacco History: Social History   Tobacco Use  Smoking Status Former Smoker  . Packs/day: 0.30  . Years: 10.00  . Pack years: 3.00  . Types: Cigarettes  . Quit date: 07/29/1978  . Years since quitting: 42.2  Smokeless Tobacco Never Used   Counseling given: Not Answered   Outpatient Medications Prior to Visit  Medication Sig Dispense Refill  . apixaban (ELIQUIS) 5 MG TABS tablet Take 5 mg by mouth 2 (two) times daily.    . cetirizine (ZYRTEC) 10 MG tablet Take 10 mg by mouth daily as needed.    . clotrimazole-betamethasone (LOTRISONE) cream APPLY AS DIRECTED 15 g 1  . colchicine 0.6 MG tablet Take 1 tablet (0.6 mg total) by mouth 2 (two) times daily as needed (for GOUT flares). 60 tablet 0  . etodolac (LODINE XL) 400 MG 24 hr tablet Take 400 mg by mouth daily.    . Evolocumab (REPATHA SURECLICK) 937 MG/ML SOAJ Inject 140 mg into the skin every 14 (fourteen) days. 2 mL 11  . glucose blood test strip Test blood once a day as directed. For One Touch Device. 300 each 3  . ibuprofen (ADVIL) 800 MG tablet Take by mouth.    . metFORMIN (GLUCOPHAGE) 1000 MG tablet Take 1 tablet by mouth 2 (two) times daily.    [name]  multivitamin (THERAGRAN) per tablet Take 1 tablet by mouth daily.    . ONE TOUCH LANCETS MISC Use as directed for blood glucose monitoring 600 each 3  . sildenafil (VIAGRA) 100 MG tablet Take 100 mg by mouth daily as needed.    . sulfamethoxazole-trimethoprim (BACTRIM DS) 800-160 MG tablet Take 1 tablet by mouth 2 (two) times daily. 20 tablet 0  . tamsulosin (FLOMAX) 0.4 MG CAPS capsule Take 1 capsule (0.4 mg total) by mouth at bedtime. 30 capsule 11  . valsartan-hydrochlorothiazide (DIOVAN-HCT) 160-25 MG per tablet Take 1 tablet by mouth daily.     No facility-administered medications prior to visit.     Review of Systems:   Constitutional:   No  weight loss, night  sweats,  Fevers, chills, fatigue, or  lassitude.  HEENT:   No headaches,  Difficulty swallowing,  Tooth/dental problems, or  Sore throat,                No sneezing, itching, ear ache, nasal congestion, post nasal drip,   CV:  No chest pain,  Orthopnea, PND, swelling in lower extremities, anasarca, dizziness, palpitations, syncope.   GI  No heartburn, indigestion, abdominal pain, nausea, vomiting, diarrhea, change in bowel habits, loss of appetite, bloody stools.   Resp: No shortness of breath with exertion or at rest.  No excess mucus, no productive cough,  No non-productive cough,  No coughing up of blood.  No change in color of mucus.  No wheezing.  No chest wall deformity  Skin: no rash or lesions.  GU: no dysuria, change in color of urine, no urgency or frequency.  No flank pain, no hematuria   MS:  No joint pain or swelling.  No decreased range of motion.  No back pain.    Physical Exam  BP 120/80 (BP Location: Left Arm, Patient Position: Sitting, Cuff Size: Normal)   Pulse (!) 107   Temp 97.6 F (36.4 C) (Temporal)   Ht 5\' 11"  (1.803 m)   Wt 217 lb 12.8 oz (98.8 kg)   SpO2 99%   BMI 30.38 kg/m   GEN: A/Ox3; pleasant , NAD, well nourished    HEENT:  [place]/AT,  NOSE-clear, THROAT-clear, no lesions, no postnasal drip or exudate noted.   NECK:  Supple w/ fair ROM; no JVD; normal carotid impulses w/o bruits; no thyromegaly or nodules palpated; no lymphadenopathy.    RESP  Clear  P & A; w/o, wheezes/ rales/ or rhonchi. no accessory muscle use, no dullness to percussion  CARD:  RRR, no m/r/g, tr  peripheral edema, pulses intact, no cyanosis or clubbing.  GI:   Soft & nt; nml bowel sounds; no organomegaly or masses detected.   Musco: Warm bil, no deformities or joint swelling noted.   Neuro: alert, no focal deficits noted.    Skin: Warm, no lesions or rashes     BNP No results found for: BNP  ProBNP No results found for: PROBNP  Imaging: No results  found.    No flowsheet data found.  No results found for: NITRICOXIDE      Assessment & Plan:   Obstructive sleep apnea Excellent control and compliance on nocturnal CPAP Patient education given  Plan  Patient Instructions  Continue on CPAP at bedtime Keep up the good work Do not drive if sleepy  Work on healthy weight Follow-up in 1 year with Dr. Elsworth Soho and as needed      Overweight Healthy weight loss discussed     Ellin Fitzgibbons  Yuritza Paulhus, NP 10/19/2020

## 2020-10-19 NOTE — Assessment & Plan Note (Signed)
Healthy weight loss discussed 

## 2020-10-19 NOTE — Assessment & Plan Note (Signed)
Excellent control and compliance on nocturnal CPAP Patient education given  Plan  Patient Instructions  Continue on CPAP at bedtime Keep up the good work Do not drive if sleepy  Work on healthy weight Follow-up in 1 year with Dr. Elsworth Soho and as needed

## 2020-10-19 NOTE — Addendum Note (Signed)
Addended by: Vanessa Barbara on: 10/19/2020 10:46 AM   Modules accepted: Orders

## 2020-11-09 ENCOUNTER — Telehealth: Payer: Self-pay | Admitting: *Deleted

## 2020-11-09 DIAGNOSIS — Z79899 Other long term (current) drug therapy: Secondary | ICD-10-CM

## 2020-11-09 NOTE — Telephone Encounter (Signed)
Spoke with pt about his blood work and Dr Oval Linsey recommendation, pt made aware CMP ordered and mailed to pt to get before see Dr Oval Linsey on 04/29

## 2020-11-09 NOTE — Telephone Encounter (Signed)
-----   Message from Skeet Latch, MD sent at 10/03/2020  5:32 PM EST ----- Thyroid is normal.  Kidney function is stable from when he saw his PCP, but mildly abnormal and liver function is mildly abnormal.  It will be really important to keep tight control of his diabetes and BP to prevent progressive kidney disease.  Make sure he is drinking plenty of water.  Given that the liver is mildly abnormal, no more than 2g of Tylenol per day and limit EtOH to no more than 1-2 drinks per day.  When he gets LFTs for follow up on starting the PCSK9 inhibitor, let's get a CMP instead.

## 2020-11-24 ENCOUNTER — Ambulatory Visit: Payer: Medicare Other | Admitting: Cardiovascular Disease

## 2021-02-09 IMAGING — CT CT CARDIAC CORONARY ARTERY CALCIUM SCORE
3 series · 14 of 20 positions shown, 15 images · non-contrast
Comparison: No priors.
COMPARISON: No priors.

Addendum:
EXAM:
OVER-READ INTERPRETATION  CT CHEST

The following report is an over-read performed by radiologist Dr.
Relindas Auad [REDACTED] on 08/25/2020. This
over-read does not include interpretation of cardiac or coronary
anatomy or pathology. The coronary calcium score interpretation by
the cardiologist is attached.
CLINICAL DATA: 69M with atrial fibrillation, diabetes, OSA,
hypertension and hyperlipidemia for risk stratification
Coronary Calcium Score
TECHNIQUE: The patient was scanned on a Siemens Force scanner. Axial
non-contrast 3 mm slices were carried out through the heart. The
data set was analyzed on a dedicated work station and scored using
the Agatson method.

[Series 2: casc 3.0 bv41 2 bestsyst 259 ms · axial · 0.46mm/px · z∈[-236,-160]mm · 4 of 43 slices shown, 5 images]
[im 9/43  vessel]
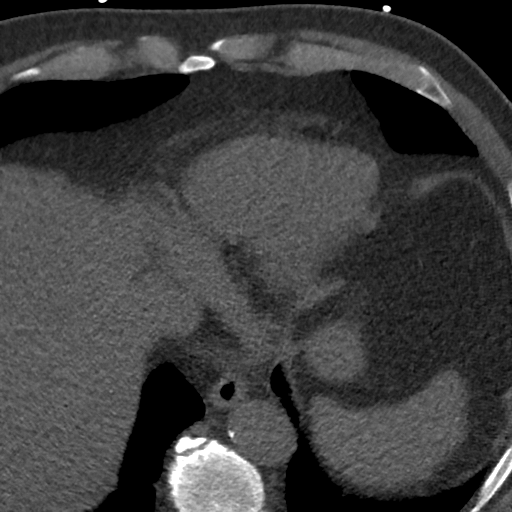
[im 9/43  lung]
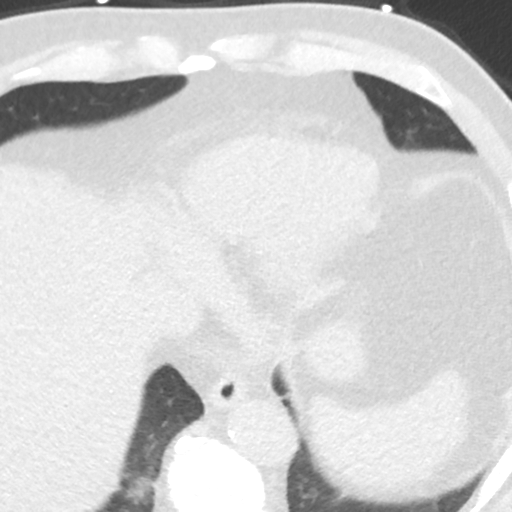
[im 17/43  vessel]
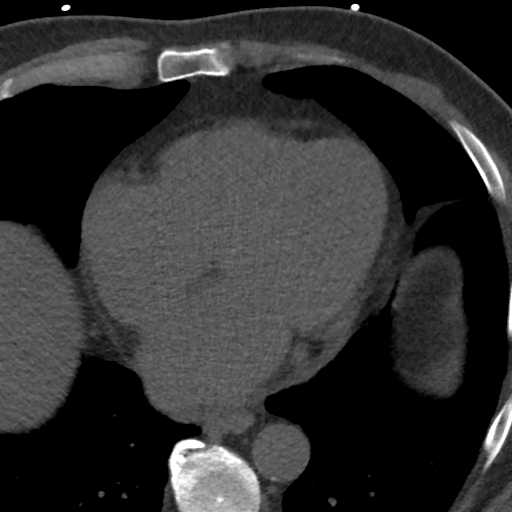
[im 26/43  vessel]
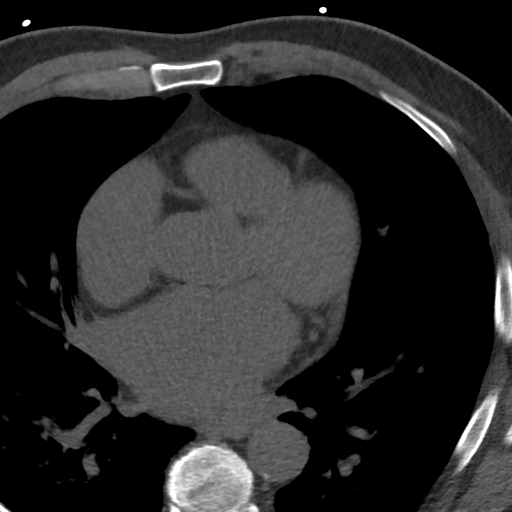
[im 34/43  vessel]
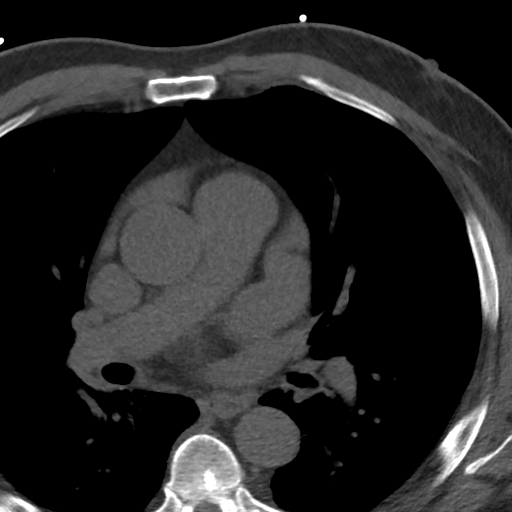

[Series 3: lung 259 ms · axial · 0.72mm/px · z∈[-238,-154]mm · 5 of 43 slices shown]
[im 8/43  lung]
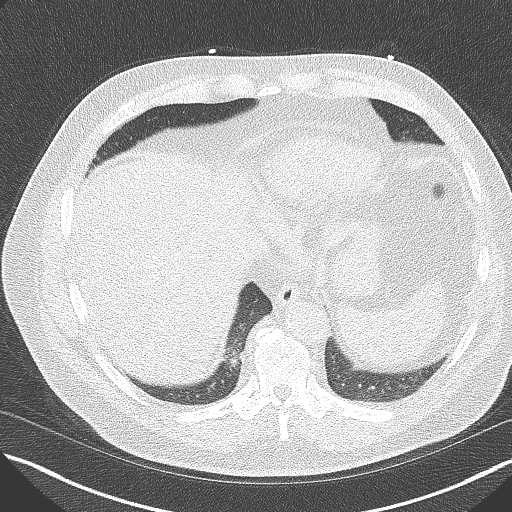
[im 15/43  lung]
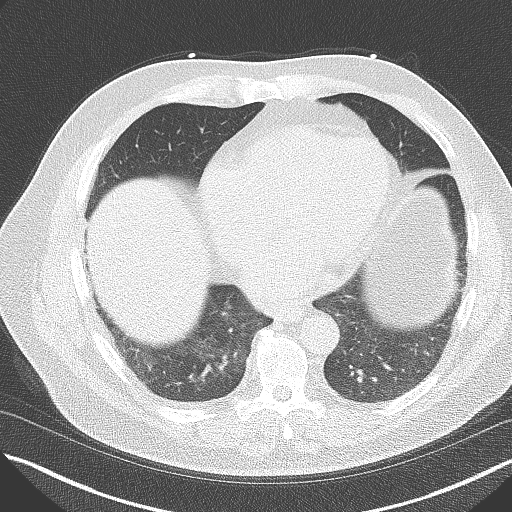
[im 22/43  lung]
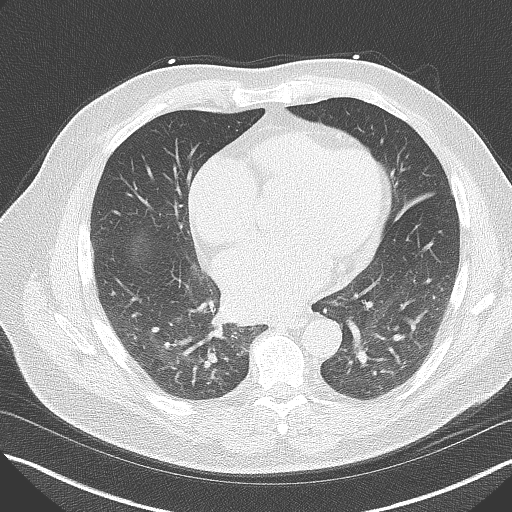
[im 29/43  lung]
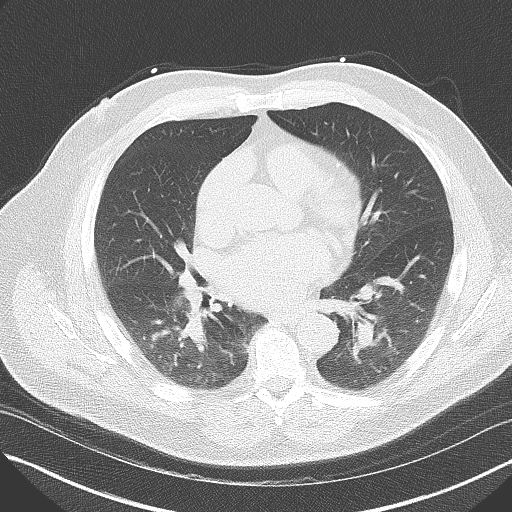
[im 36/43  lung]
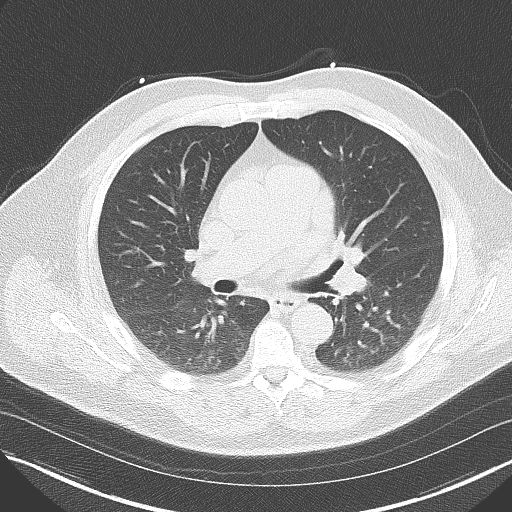

[Series 4: lung st 259 ms · axial · 0.72mm/px · z∈[-238,-154]mm · 5 of 43 slices shown]
[im 8/43  lung]
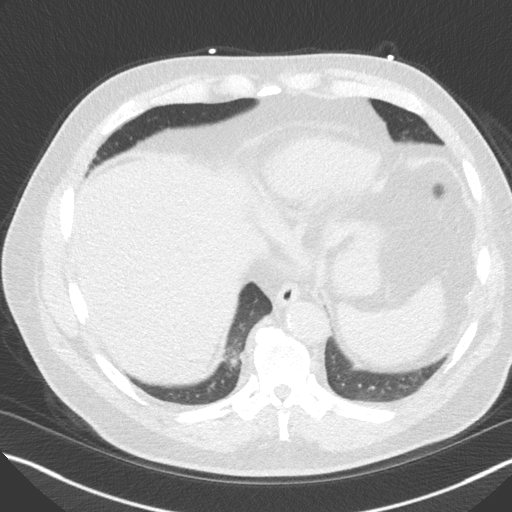
[im 15/43  lung]
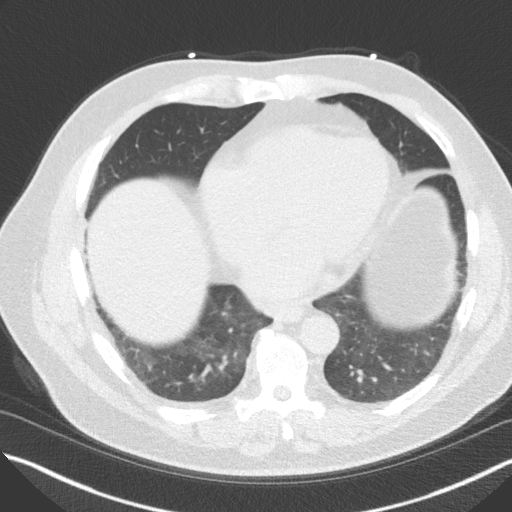
[im 22/43  lung]
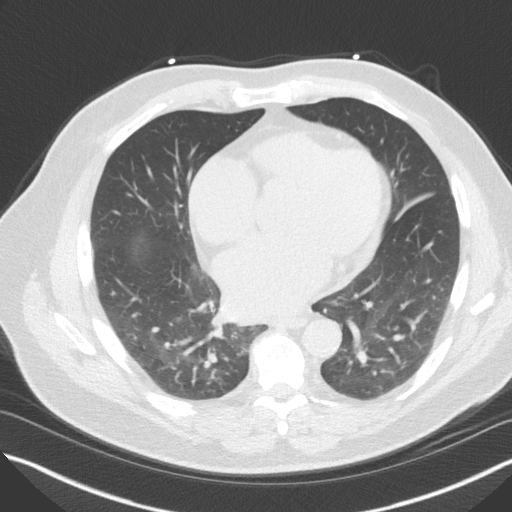
[im 29/43  lung]
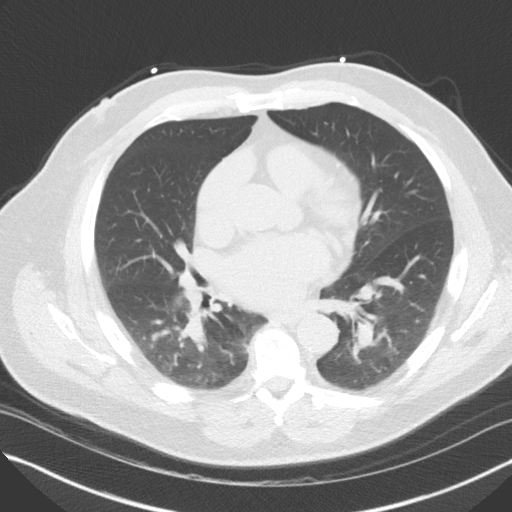
[im 36/43  lung]
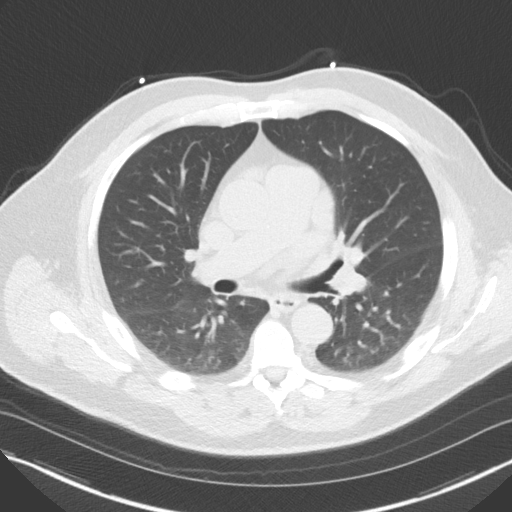

[14 of 20 positions shown; findings below may reference images not displayed]

FINDINGS: Aortic atherosclerosis. Within the visualized portions of the thorax
there are no suspicious appearing pulmonary nodules or masses, there
is no acute consolidative airspace disease, no pleural effusions, no
pneumothorax and no lymphadenopathy. Visualized portions of the
upper abdomen demonstrates mild diffuse low attenuation throughout
the visualized hepatic parenchyma, indicative of hepatic steatosis.
There are no aggressive appearing lytic or blastic lesions noted in
the visualized portions of the skeleton.
IMPRESSION: 1.  Aortic Atherosclerosis (2VYZR-XIT.T).
2. Hepatic steatosis.
FINDINGS: Non-cardiac: See separate report from [REDACTED].

Ascending Aorta: Normal size.  No calcification.

Pericardium: Normal

Coronary arteries: Normal coronary artery origins. Right dominant.
Scattered coronary calcification in the LAD and LCX territories.
IMPRESSION: Coronary calcium score of 30.2. This was 46th percentile for age and
sex matched control.

Recommend aggressive risk factor modification.

*** End of Addendum ***
EXAM:
OVER-READ INTERPRETATION  CT CHEST

The following report is an over-read performed by radiologist Dr.
Relindas Auad [REDACTED] on 08/25/2020. This
over-read does not include interpretation of cardiac or coronary
anatomy or pathology. The coronary calcium score interpretation by
the cardiologist is attached.
FINDINGS: Aortic atherosclerosis. Within the visualized portions of the thorax
there are no suspicious appearing pulmonary nodules or masses, there
is no acute consolidative airspace disease, no pleural effusions, no
pneumothorax and no lymphadenopathy. Visualized portions of the
upper abdomen demonstrates mild diffuse low attenuation throughout
the visualized hepatic parenchyma, indicative of hepatic steatosis.
There are no aggressive appearing lytic or blastic lesions noted in
the visualized portions of the skeleton.
IMPRESSION: 1.  Aortic Atherosclerosis (2VYZR-XIT.T).
2. Hepatic steatosis.

## 2021-03-19 ENCOUNTER — Ambulatory Visit (HOSPITAL_BASED_OUTPATIENT_CLINIC_OR_DEPARTMENT_OTHER): Payer: Medicare Other | Admitting: Cardiovascular Disease

## 2021-05-10 ENCOUNTER — Ambulatory Visit (HOSPITAL_BASED_OUTPATIENT_CLINIC_OR_DEPARTMENT_OTHER): Payer: Medicare Other | Admitting: Cardiovascular Disease

## 2021-05-16 ENCOUNTER — Ambulatory Visit (INDEPENDENT_AMBULATORY_CARE_PROVIDER_SITE_OTHER): Payer: Medicare Other | Admitting: Cardiovascular Disease

## 2021-05-16 ENCOUNTER — Other Ambulatory Visit: Payer: Self-pay

## 2021-05-16 ENCOUNTER — Encounter (HOSPITAL_BASED_OUTPATIENT_CLINIC_OR_DEPARTMENT_OTHER): Payer: Self-pay | Admitting: Cardiovascular Disease

## 2021-05-16 VITALS — BP 110/76 | HR 96 | Ht 71.0 in | Wt 221.2 lb

## 2021-05-16 DIAGNOSIS — E7800 Pure hypercholesterolemia, unspecified: Secondary | ICD-10-CM | POA: Diagnosis not present

## 2021-05-16 DIAGNOSIS — G4733 Obstructive sleep apnea (adult) (pediatric): Secondary | ICD-10-CM | POA: Diagnosis not present

## 2021-05-16 DIAGNOSIS — I10 Essential (primary) hypertension: Secondary | ICD-10-CM | POA: Diagnosis not present

## 2021-05-16 DIAGNOSIS — Z6830 Body mass index (BMI) 30.0-30.9, adult: Secondary | ICD-10-CM

## 2021-05-16 DIAGNOSIS — I4819 Other persistent atrial fibrillation: Secondary | ICD-10-CM

## 2021-05-16 DIAGNOSIS — I2510 Atherosclerotic heart disease of native coronary artery without angina pectoris: Secondary | ICD-10-CM | POA: Diagnosis not present

## 2021-05-16 DIAGNOSIS — E6609 Other obesity due to excess calories: Secondary | ICD-10-CM

## 2021-05-16 HISTORY — DX: Atherosclerotic heart disease of native coronary artery without angina pectoris: I25.10

## 2021-05-16 NOTE — Progress Notes (Signed)
Cardiology Office Note   Date:  05/16/2021   ID:  Levi Lee, DOB 1951/06/05, MRN [id number]  PCP:  Chesley Noon, MD  Cardiologist:   Skeet Latch, MD   No chief complaint on file.    History of Present Illness: Levi Lee is a 71 y.o. male taking with paroxysmal atrial fibrillation, diabetes, hypertension, CKD 3, hyperlipidemia, and OSA on CPAP who is being seen today for the evaluation of tachycardia at the request of Chesley Noon, MD.  Levi Lee was seen in the ER 08/22/20 with urinary retention.  He was found to have prostatitis.  He notes that he started taking a new OTC cholesterol medication a couple days prior.  EKG was consistent with atrial fibrillation at a rate of 117 bpm.  Metoprolol was added to his regimen.  He was started on Bactrim and started feeling relief in his suprapubic discomfort within a couple hours.  He denies any tachycardia.  He is not have any lightheadedness or dizziness.  He was advised to start taking metoprolol.  Levi Lee has a history of atrial fibrillation previously treated by cardiologist at [facility] in 2017.  He has been on Eliquis since that time.  He has not required rate control.  He had a stress test at [facility] in 2017.  He was found to have LVEF 64% with no ischemia.  Levi Lee has a history of hyperlipidemia but has not tolerated statins. He recalls trying atorvastatin, rosuvastatin, and pravastatin.  He was refferred for a calcium score 07/2020 which was 46th percentile for age and gender. He was started on Repatha.  He has been tolerating this well.  He had repeat lipids with his PCP and they have improved but his LDL was 114.  He does not check his blood pressure often but when he does it is always less than 120 over 70s.  He recently was switched from metformin to [place] and glipizide.  His blood sugar was 118 for the first time in a long time.  He thinks his hemoglobin A1c was 8.0%.  He noted a shooting pain in his right foot today.  He  has not had any chest pain or shortness of breath.  He has rare episodes of lower extremity edema but no orthopnea or PND.  He continues to use his CPAP faithfully.  When he checks his heart rate is typically in the 90s to 100s.   Past Medical History:  Diagnosis Date   Abnormal liver function tests    BPH (benign prostatic hyperplasia)    CAD in native artery 05/16/2021   Diabetes mellitus type 2 in obese ([place]) 08/23/2020   Diabetes mellitus without complication (HCC)    Diverticulosis of colon    DJD (degenerative joint disease)    Hx of colonic polyps    Hypercholesterolemia    Hypertension    OSA (obstructive sleep apnea)    Other abnormal glucose    Overweight(278.02)    Persistent atrial fibrillation ([place]) 08/23/2020   Sleep apnea    wears CPAP    Past Surgical History:  Procedure Laterality Date   VASECTOMY     by Dr. Janice Norrie     Current Outpatient Medications  Medication Sig Dispense Refill   apixaban (ELIQUIS) 5 MG TABS tablet Take 5 mg by mouth 2 (two) times daily.     cetirizine (ZYRTEC) 10 MG tablet Take 10 mg by mouth daily as needed.     clotrimazole-betamethasone (LOTRISONE) cream APPLY AS DIRECTED 15  g 1   colchicine 0.6 MG tablet Take 1 tablet (0.6 mg total) by mouth 2 (two) times daily as needed (for GOUT flares). 60 tablet 0   etodolac (LODINE XL) 400 MG 24 hr tablet Take 400 mg by mouth daily.     Evolocumab (REPATHA SURECLICK) 259 MG/ML SOAJ Inject 140 mg into the skin every 14 (fourteen) days. 2 mL 11   FARXIGA 10 MG TABS tablet Take 10 mg by mouth daily.     glipiZIDE (GLUCOTROL XL) 10 MG 24 hr tablet Take 10 mg by mouth daily.     glucose blood test strip Test blood once a day as directed. For One Touch Device. 300 each 3   ibuprofen (ADVIL) 800 MG tablet Take by mouth.     losartan-hydrochlorothiazide (HYZAAR) 100-25 MG tablet Take 1 tablet by mouth daily.     multivitamin (THERAGRAN) per tablet Take 1 tablet by mouth daily.     ONE TOUCH LANCETS MISC  Use as directed for blood glucose monitoring 600 each 3   sildenafil (VIAGRA) 100 MG tablet Take 100 mg by mouth daily as needed.     tamsulosin (FLOMAX) 0.4 MG CAPS capsule Take 1 capsule (0.4 mg total) by mouth at bedtime. 30 capsule 11   valsartan-hydrochlorothiazide (DIOVAN-HCT) 160-25 MG per tablet Take 1 tablet by mouth daily.     metFORMIN (GLUCOPHAGE) 1000 MG tablet Take 1 tablet by mouth 2 (two) times daily. (Patient not taking: Reported on 05/16/2021)     sulfamethoxazole-trimethoprim (BACTRIM DS) 800-160 MG tablet Take 1 tablet by mouth 2 (two) times daily. (Patient not taking: Reported on 05/16/2021) 20 tablet 0   No current facility-administered medications for this visit.    Allergies:   Statins    Social History:  The patient  reports that he quit smoking about 42 years ago. His smoking use included cigarettes. He has a 3.00 pack-year smoking history. He has never used smokeless tobacco. He reports current alcohol use. He reports that he does not use drugs.   Family History:  The patient's family history includes Cancer in his paternal uncle.    ROS:  Please see the history of present illness.     All other systems are reviewed and negative.    PHYSICAL EXAM: VS:  BP 110/76   Pulse 96   Ht 5\' 11"  (1.803 m)   Wt 221 lb 3.2 oz (100.3 kg)   SpO2 96%   BMI 30.85 kg/m  , BMI Body mass index is 30.85 kg/m. GENERAL:  Well appearing HEENT:  Pupils equal round and reactive, fundi not visualized, oral mucosa unremarkable NECK:  No jugular venous distention, waveform within normal limits, carotid upstroke brisk and symmetric, no bruits LUNGS:  Clear to auscultation bilaterally HEART:  Irregularly irregular.  PMI not displaced or sustained,S1 and S2 within normal limits, no S3, no S4, no clicks, no rubs, no murmurs ABD:  Flat, positive bowel sounds normal in frequency in pitch, no bruits, no rebound, no guarding, no midline pulsatile mass, no hepatomegaly, no  splenomegaly EXT:  2 plus pulses throughout, no edema, no cyanosis no clubbing SKIN:  No rashes no nodules NEURO:  Cranial nerves II through XII grossly intact, motor grossly intact throughout PSYCH:  Cognitively intact, oriented to person place and time  EKG:   05/16/2021: Atrial fibrillation.  Rate 96 bpm.   08/23/2019 atrial fibrillation.  Rate 170 bpm.  Following studies were reviewed: No previous cardiovascular studies  Recent Labs: 09/05/2020: ALT 57; BUN  34; Creatinine, Ser 1.54; Hemoglobin 14.8; Magnesium 2.3; Platelets 143; Potassium 4.8; Sodium 137; TSH 2.080   05/31/2020: Sodium 138, potassium 4.5, BUN 25, creatinine 1.58 Total cholesterol 222, triglycerides 115, HDL 45, LDL 156 Hemoglobin A1c 7.3%   Lipid Panel    Component Value Date/Time   CHOL 195 01/13/2013 0752   TRIG 261.0 (H) 01/13/2013 0752   HDL 37.60 (L) 01/13/2013 0752   CHOLHDL 5 01/13/2013 0752   VLDL 52.2 (H) 01/13/2013 0752   LDLCALC 127 (H) 11/12/2011 0748   LDLDIRECT 114.8 01/13/2013 0752      Wt Readings from Last 3 Encounters:  05/16/21 221 lb 3.2 oz (100.3 kg)  10/19/20 217 lb 12.8 oz (98.8 kg)  09/15/20 220 lb 6.4 oz (100 kg)      ASSESSMENT AND PLAN: No problem-specific Assessment & Plan notes found for this encounter.  # Persistent atrial fibrillation:  Levi Lee remains in atrial fibrillation.  His heart rate has been in the 90s to 100s.  We did discuss starting a beta-blocker to keep it under 100 at rest.  He is hesitant to add any new medications given that he feels well and has no palpitations.  Continue Eliquis.  Given that he does not have any heart failure symptoms we will not add it for now.  # CAD, native:  # Hyperlipidemia: # Statin intolerance:  Levi Lee has not tolerated several statins.  He is doing well on Repatha.  His LDL did not fall under 70 when it was checked.  He is going to really focus on diet and exercise and we will repeat lipids and a CMP in 2 to 3 months.   If his LDL remains greater than 70 at that time we will need to add an additional agent.  He was noted to have coronary calcification on calcium score.  He is not on Eliquis given that he takes aspirin.  # Essential hypertension:  Blood pressure is well-controlled.  Continue valsartan/HCTZ.  We discussed metoprolol but he declines.  # OSA:  Continue CPAP.  # Diabetes:  # CKD III: Recently started [place] and glipizide.  Followed closely by Dr. Melford Aase.   Current medicines are reviewed at length with the patient today.  The patient does not have concerns regarding medicines.  The following changes have been made:  none  Labs/ tests ordered today include:   Orders Placed This Encounter  Procedures   Lipid panel   Comprehensive metabolic panel   EKG [id number]      Disposition:   FU with Andreanna Mikolajczak C. Oval Linsey, MD, [facility] in 1 year      Signed, Jaima Janney C. Oval Linsey, MD, [facility]  05/16/2021 1:59 PM    [place]

## 2021-05-16 NOTE — Patient Instructions (Signed)
Medication Instructions:  Your physician recommends that you continue on your current medications as directed. Please refer to the Current Medication list given to you today.   *If you need a refill on your cardiac medications before your next appointment, please call your pharmacy*  Lab Work: FASTING LP/CMET IN 3 MONTHS   If you have labs (blood work) drawn today and your tests are completely normal, you will receive your results only by: [place] (if you have MyChart) OR A paper copy in the mail If you have any lab test that is abnormal or we need to change your treatment, we will call you to review the results.  Testing/Procedures: NONE  Follow-Up: At [facility], you and your health needs are our priority.  As part of our continuing mission to provide you with exceptional heart care, we have created designated Provider Care Teams.  These Care Teams include your primary Cardiologist (physician) and Advanced Practice Providers (APPs -  Physician Assistants and Nurse Practitioners) who all work together to provide you with the care you need, when you need it.  We recommend signing up for the patient portal called "MyChart".  Sign up information is provided on this After Visit Summary.  MyChart is used to connect with patients for Virtual Visits (Telemedicine).  Patients are able to view lab/test results, encounter notes, upcoming appointments, etc.  Non-urgent messages can be sent to your provider as well.   To learn more about what you can do with MyChart, go to [email or web address].    Your next appointment:   12 month(s)  The format for your next appointment:   In Person  Provider:   Skeet Latch, MD  TRY TO EXERCISE [place]

## 2021-07-25 ENCOUNTER — Telehealth: Payer: Self-pay

## 2021-07-25 NOTE — Telephone Encounter (Signed)
Lipid labs needed

## 2021-08-08 LAB — COMPREHENSIVE METABOLIC PANEL
ALT: 36 IU/L (ref 0–44)
AST: 32 IU/L (ref 0–40)
Albumin/Globulin Ratio: 2 (ref 1.2–2.2)
Albumin: 4.7 g/dL (ref 3.7–4.7)
Alkaline Phosphatase: 70 IU/L (ref 44–121)
BUN/Creatinine Ratio: 20 (ref 10–24)
BUN: 32 mg/dL — ABNORMAL HIGH (ref 8–27)
Bilirubin Total: 0.9 mg/dL (ref 0.0–1.2)
CO2: 24 mmol/L (ref 20–29)
Calcium: 10.2 mg/dL (ref 8.6–10.2)
Chloride: 96 mmol/L (ref 96–106)
Creatinine, Ser: 1.61 mg/dL — ABNORMAL HIGH (ref 0.76–1.27)
Globulin, Total: 2.4 g/dL (ref 1.5–4.5)
Glucose: 137 mg/dL — ABNORMAL HIGH (ref 70–99)
Potassium: 4.5 mmol/L (ref 3.5–5.2)
Sodium: 133 mmol/L — ABNORMAL LOW (ref 134–144)
Total Protein: 7.1 g/dL (ref 6.0–8.5)
eGFR: 45 mL/min/{1.73_m2} — ABNORMAL LOW (ref >59)

## 2021-08-08 LAB — LIPID PANEL
Chol/HDL Ratio: 3.1 ratio (ref 0.0–5.0)
Cholesterol, Total: 173 mg/dL (ref 100–199)
HDL: 56 mg/dL (ref >39)
LDL Chol Calc (NIH): 102 mg/dL — ABNORMAL HIGH (ref 0–99)
Triglycerides: 82 mg/dL (ref 0–149)
VLDL Cholesterol Cal: 15 mg/dL (ref 5–40)

## 2021-08-15 ENCOUNTER — Telehealth: Payer: Self-pay | Admitting: Cardiovascular Disease

## 2021-08-15 NOTE — Telephone Encounter (Signed)
Spoke with wife, advised no A1C   Will mail copy of labs

## 2021-08-15 NOTE — Telephone Encounter (Signed)
Pt has additional questions for [facility] please advise

## 2021-08-17 ENCOUNTER — Telehealth (HOSPITAL_BASED_OUTPATIENT_CLINIC_OR_DEPARTMENT_OTHER): Payer: Self-pay | Admitting: Cardiovascular Disease

## 2021-08-17 NOTE — Telephone Encounter (Signed)
Received fax from [facility] stating pt has been approved for Repatha 140 mg/ml from 07/17/21-08/16/22.

## 2021-08-21 ENCOUNTER — Telehealth: Payer: Self-pay | Admitting: Pulmonary Disease

## 2021-08-21 ENCOUNTER — Other Ambulatory Visit: Payer: Self-pay

## 2021-08-21 DIAGNOSIS — G4733 Obstructive sleep apnea (adult) (pediatric): Secondary | ICD-10-CM

## 2021-08-21 NOTE — Telephone Encounter (Signed)
Called and spoke with wife and she stated that she needed supplies for her husbands current new cpap machine. That the supplies they are sending out dont fit the machine he is using. I sent in an order for [employer] to send out the supplies for his new machine. Patient had no other questions at this time. Nothing further needed at this time

## 2021-08-28 ENCOUNTER — Telehealth (HOSPITAL_BASED_OUTPATIENT_CLINIC_OR_DEPARTMENT_OTHER): Payer: Self-pay | Admitting: Cardiovascular Disease

## 2021-08-28 MED ORDER — REPATHA SURECLICK 140 MG/ML ~~LOC~~ SOAJ: 140.0000 mg | SUBCUTANEOUS | 3 refills | Status: DC

## 2021-08-28 NOTE — Telephone Encounter (Signed)
New Message:   Patient wants to know if Dr Oval Linsey wants the patient to take Valsartan, if so he will need a prescription for it? Also does he need to continue taking the Repatha? If so, will need a refill on that  please.

## 2021-08-28 NOTE — Telephone Encounter (Signed)
Dr. Awanda Mink- please advise on whether patient should be taking Valsartan or Losartan, both are on the patients medication list!   Pharm pool- can we please get a refill for the patients repatha sent to [facility] 90 day supply?

## 2021-08-28 NOTE — Telephone Encounter (Signed)
Repatha refill sent in.

## 2021-08-28 NOTE — Addendum Note (Signed)
Addended by: Alvina Filbert B on: 08/28/2021 10:45 AM   Modules accepted: Orders

## 2021-08-28 NOTE — Telephone Encounter (Signed)
Oh goodness.  Has he been taking both?  He only needs to be on 1 or the other.  It does not matter to me which one he has.  Refill whichever he has actually been taking.  If he is taking both then I'd choose the valsartan/HCTZ and stop losartan/HCTZ.    Thanks,  TCR    Patient has been taking Losartan HCT only. He will continue Losartan HCT as he been taking  Advised wife, verbalized understanding

## 2021-10-08 ENCOUNTER — Other Ambulatory Visit: Payer: Self-pay

## 2021-10-08 ENCOUNTER — Ambulatory Visit (INDEPENDENT_AMBULATORY_CARE_PROVIDER_SITE_OTHER): Payer: Medicare Other | Admitting: Pulmonary Disease

## 2021-10-08 ENCOUNTER — Encounter: Payer: Self-pay | Admitting: Pulmonary Disease

## 2021-10-08 DIAGNOSIS — I4819 Other persistent atrial fibrillation: Secondary | ICD-10-CM

## 2021-10-08 DIAGNOSIS — G4733 Obstructive sleep apnea (adult) (pediatric): Secondary | ICD-10-CM

## 2021-10-08 NOTE — Progress Notes (Signed)
? ?  Subjective:  ? ? Patient ID: Levi Lee, male    DOB: 1951-01-04, 71 y.o.   MRN: [id number] ? ?HPI ?71 yo for FU of OSA ? ?PM H-diabetes, hypertension, atrial fibrillation on Eliquis ? ?He could not tolerate high pressures on CPAP with more centrals on higher pressures.  Maintained on auto CPAP 5 to 12 cm  ? ?Annual follow-up visit. ?He reports feeling rested, denies somnolence in the daytime or fatigue.  He got a new mask and he likes it he actually got a new machine within the last year ? ?Denies palpitations, remains on apixaban for A-fib ? ?Significant tests/ events reviewed ? ?NPSG 2013:  AHI 69/hr, optimal cpap 17cm (higher pressures caused increased centrals) ? ?Review of Systems ?neg for any significant sore throat, dysphagia, itching, sneezing, nasal congestion or excess/ purulent secretions, fever, chills, sweats, unintended wt loss, pleuritic or exertional cp, hempoptysis, orthopnea pnd or change in chronic leg swelling. Also denies presyncope, palpitations, heartburn, abdominal pain, nausea, vomiting, diarrhea or change in bowel or urinary habits, dysuria,hematuria, rash, arthralgias, visual complaints, headache, numbness weakness or ataxia. ? ?   ?Objective:  ? Physical Exam ? ?Gen. Pleasant, obese, in no distress ?ENT - no lesions, no post nasal drip ?Neck: No JVD, no thyromegaly, no carotid bruits ?Lungs: no use of accessory muscles, no dullness to percussion, decreased without rales or rhonchi  ?Cardiovascular: Rhythm irregular, heart sounds  normal, no murmurs or gallops, no peripheral edema ?Musculoskeletal: No deformities, no cyanosis or clubbing , no tremors ? ? ? ?   ?Assessment & Plan:  ? ? ?

## 2021-10-08 NOTE — Addendum Note (Signed)
Addended by: Fran Lowes on: 10/08/2021 02:51 PM ? ? Modules accepted: Orders ? ?

## 2021-10-08 NOTE — Assessment & Plan Note (Signed)
Benefits of CPAP and atrial fibrillation was discussed ?

## 2021-10-08 NOTE — Patient Instructions (Signed)
?  X CPAP supplies will be renewed x 1 year ?

## 2021-10-08 NOTE — Assessment & Plan Note (Signed)
CPAP download shows residual AHI of 10/hour.  On further review centrals of 4.4/hour and obstructive daughter/hour, maximum pressure is 12 cm and auto settings 5 to 12 cm and he has no leak. ?Doubt that increase in pressure will help.  He had good control of events on his download last year.  Overall I still feel that this is within acceptable range on average however on certain nights his AHI seems to go as high as 20/hour. ?Since he is asymptomatic, we will just observe him and repeat a download in a few months.  He will call us back if he develops daytime hypersomnolence and fatigue ? ?Weight loss encouraged, compliance with goal of at least 4-6 hrs every night is the expectation. ?Advised against medications with sedative side effects ?Cautioned against driving when sleepy - understanding that sleepiness will vary on a day to day basis ? ?

## 2022-02-04 ENCOUNTER — Telehealth: Payer: Self-pay | Admitting: Cardiovascular Disease

## 2022-02-04 NOTE — Telephone Encounter (Signed)
See message from [place], [facility] below. [facility] Prior Author[facility] is currently down and they are working to fix the issue per [employer] support member.

## 2022-02-04 NOTE — Telephone Encounter (Signed)
Patient wife called in stating [facility] needs a prior [place] for Farxiga. Per cover my meds prior auth not required, RN attempted prior auth anyway for [employer] and received the following message,    "There is a problem finding your patient. Please confirm the patient name, DOB, gender, and zip code was correctly entered, and click Check Eligibility to try again. If you continue to have trouble, contact [employer] at [phone]."   RN called the above number and was on hold for 10 minutes before ending the call.   Key: [id number]   Routing to [employer], LPN, Ernie Hew, CMA and PharmD team for assistance

## 2022-02-04 NOTE — Telephone Encounter (Signed)
Pt c/o medication issue:  1. Name of Medication: farxiga   2. How are you currently taking this medication (dosage and times per day)?   3. Are you having a reaction (difficulty breathing--STAT)? no  4. What is your medication issue? Patient's wife states they need prior authorization for the medication and he is currently out of medication.

## 2022-02-04 NOTE — Telephone Encounter (Signed)
Will have to remain pending for time being.    "This request is taking longer than normal to send to the plan. Our team is aware and working with the plan to resolve this now. Once this is resolved, you will be able to submit this PA to the plan. If the request is urgent, we recommend reaching out to the plan directly. If you need help reaching the plan, you can click to chat with [place] at the bottom right corner of your screen or call us at [phone]."

## 2022-02-05 NOTE — Telephone Encounter (Signed)
7/11 [facility] still down, NP notified.

## 2022-02-07 NOTE — Telephone Encounter (Signed)
[place] PA completed however says that there is already one in progress

## 2022-02-07 NOTE — Telephone Encounter (Signed)
Spoke with insurance company, Farxiga approved from 01/08/2022-02/07/2023  Advised wife and [employer]

## 2022-02-07 NOTE — Telephone Encounter (Signed)
Phone encounter I started for Prior auth

## 2022-02-07 NOTE — Telephone Encounter (Signed)
I checked the one I did and the one you did and they both say "Additional Information Required A PA is already in process for this member/drug. For further inquiries please contact the number on the back of the member prescription card. (Message [phone])".

## 2022-04-03 ENCOUNTER — Other Ambulatory Visit (HOSPITAL_BASED_OUTPATIENT_CLINIC_OR_DEPARTMENT_OTHER): Payer: Self-pay | Admitting: Cardiovascular Disease

## 2022-04-03 NOTE — Telephone Encounter (Signed)
Rx request sent to pharmacy.  

## 2022-05-20 ENCOUNTER — Ambulatory Visit (INDEPENDENT_AMBULATORY_CARE_PROVIDER_SITE_OTHER): Payer: Medicare Other | Admitting: Cardiovascular Disease

## 2022-05-20 ENCOUNTER — Encounter (HOSPITAL_BASED_OUTPATIENT_CLINIC_OR_DEPARTMENT_OTHER): Payer: Self-pay | Admitting: Cardiovascular Disease

## 2022-05-20 VITALS — BP 118/68 | HR 119 | Ht 71.0 in | Wt 221.4 lb

## 2022-05-20 DIAGNOSIS — I2510 Atherosclerotic heart disease of native coronary artery without angina pectoris: Secondary | ICD-10-CM

## 2022-05-20 DIAGNOSIS — G4733 Obstructive sleep apnea (adult) (pediatric): Secondary | ICD-10-CM

## 2022-05-20 DIAGNOSIS — I4819 Other persistent atrial fibrillation: Secondary | ICD-10-CM | POA: Diagnosis not present

## 2022-05-20 DIAGNOSIS — I10 Essential (primary) hypertension: Secondary | ICD-10-CM | POA: Diagnosis not present

## 2022-05-20 DIAGNOSIS — E7800 Pure hypercholesterolemia, unspecified: Secondary | ICD-10-CM

## 2022-05-20 NOTE — Patient Instructions (Signed)
Medication Instructions:  Your physician recommends that you continue on your current medications as directed. Please refer to the Current Medication list given to you today.  *If you need a refill on your cardiac medications before your next appointment, please call your pharmacy*  Lab Work: NONE  Testing/Procedures: Your physician has requested that you have an echocardiogram. Echocardiography is a painless test that uses sound waves to create images of your heart. It provides your doctor with information about the size and shape of your heart and how well your heart's chambers and valves are working. This procedure takes approximately one hour. There are no restrictions for this procedure. Please do NOT wear cologne, perfume, aftershave, or lotions (deodorant is allowed). Please arrive 15 minutes prior to your appointment time.  Follow-Up: At [facility], you and your health needs are our priority.  As part of our continuing mission to provide you with exceptional heart care, we have created designated Provider Care Teams.  These Care Teams include your primary Cardiologist (physician) and Advanced Practice Providers (APPs -  Physician Assistants and Nurse Practitioners) who all work together to provide you with the care you need, when you need it.  We recommend signing up for the patient portal called "MyChart".  Sign up information is provided on this After Visit Summary.  MyChart is used to connect with patients for Virtual Visits (Telemedicine).  Patients are able to view lab/test results, encounter notes, upcoming appointments, etc.  Non-urgent messages can be sent to your provider as well.   To learn more about what you can do with MyChart, go to [email or web address].    Your next appointment:   12 month(s)  The format for your next appointment:   In Person  Provider:   Skeet Latch, MD

## 2022-05-20 NOTE — Progress Notes (Signed)
Cardiology Office Note   Date:  05/20/2022   ID:  Levi Lee, DOB 1951/07/19, MRN [id number]  PCP:  Chesley Noon, MD  Cardiologist:   Skeet Latch, MD   No chief complaint on file.     Lee of Present Illness: Levi Lee is Lee 71 y.o. male taking with persistent atrial fibrillation, diabetes, hypertension, CKD 3, hyperlipidemia, and OSA on CPAP who is being seen today for the evaluation of tachycardia at the request of Chesley Noon, MD.  Levi Lee was seen in the ER 08/22/20 with urinary retention.  He was found to have prostatitis.  He notes that he started taking Lee new OTC cholesterol medication Lee couple days prior.  EKG was consistent with atrial fibrillation at Lee rate of 117 bpm.  Metoprolol was added to his regimen.  He is asymptomatic from an atrial fibrillation standpoint.  Levi Lee of atrial fibrillation previously treated by cardiologist at [facility] in 2017.  He has been on Eliquis since that time.  He has not required rate control.  He had Lee stress test at [facility] in 2017.  He was found to have LVEF 64% with no ischemia.  Levi Lee has Lee Lee of hyperlipidemia but has not tolerated statins. He recalls trying atorvastatin, rosuvastatin, and pravastatin.  He was refferred for Lee calcium score 07/2020 which was 46th percentile for age and gender. He was started on Repatha.  He has been tolerating this well.  He had repeat lipids with his PCP and they have improved but his LDL was 114.  He does not check his blood pressure often but when he does it is always less than 120 over 70s.  At his last appointment he was feeling well but lipids were uncontrolled.  He wanted to work on diet and exercise rather than add any new medications.  Lately he has been feeling well.  He has been trying to work on his diet.  He walks 2.25 miles three days per week and has no exertional CP or SOB.  He has rare LE edema that improves with elevation of his legs.  He denies orthopnea  or PND.  Occasional leg cramps at night.  He his trying to work on his diet and exercise more. He has no palpitations.  Does not check BP regularly.  Past Medical Lee:  Diagnosis Date   Abnormal liver function tests    BPH (benign prostatic hyperplasia)    CAD in native artery 05/16/2021   Diabetes mellitus type 2 in obese ([place]) 08/23/2020   Diabetes mellitus without complication (HCC)    Diverticulosis of colon    DJD (degenerative joint disease)    Hx of colonic polyps    Hypercholesterolemia    Hypertension    OSA (obstructive sleep apnea)    Other abnormal glucose    Overweight(278.02)    Persistent atrial fibrillation ([place]) 08/23/2020   Sleep apnea    wears CPAP    Past Surgical Lee:  Procedure Laterality Date   VASECTOMY     by Dr. Janice Norrie     Current Outpatient Medications  Medication Sig Dispense Refill   apixaban (ELIQUIS) 5 MG TABS tablet Take 5 mg by mouth 2 (two) times daily.     cetirizine (ZYRTEC) 10 MG tablet Take 10 mg by mouth daily as needed.     clotrimazole-betamethasone (LOTRISONE) cream APPLY AS DIRECTED 15 g 1   colchicine 0.6 MG tablet Take 1 tablet (0.6 mg total) by  mouth 2 (two) times daily as needed (for GOUT flares). 60 tablet 0   etodolac (LODINE XL) 400 MG 24 hr tablet Take 400 mg by mouth daily.     Evolocumab (REPATHA SURECLICK) 196 MG/ML SOAJ Inject 140 mg into the skin every 14 (fourteen) days. 6 mL 3   FARXIGA 10 MG TABS tablet TAKE ONE TABLET BY MOUTH ONE TIME DAILY 90 tablet 0   glipiZIDE (GLUCOTROL XL) 10 MG 24 hr tablet Take 10 mg by mouth daily.     glucose blood test strip Test blood once Lee day as directed. For One Touch Device. 300 each 3   ibuprofen (ADVIL) 800 MG tablet Take by mouth.     losartan-hydrochlorothiazide (HYZAAR) 100-25 MG tablet Take 1 tablet by mouth daily.     metFORMIN (GLUCOPHAGE) 1000 MG tablet Take 1 tablet by mouth 2 (two) times daily.     multivitamin (THERAGRAN) per tablet Take 1 tablet by mouth daily.      ONE TOUCH LANCETS MISC Use as directed for blood glucose monitoring 600 each 3   sildenafil (VIAGRA) 100 MG tablet Take 100 mg by mouth daily as needed.     sulfamethoxazole-trimethoprim (BACTRIM DS) 800-160 MG tablet Take 1 tablet by mouth 2 (two) times daily. 20 tablet 0   tamsulosin (FLOMAX) 0.4 MG CAPS capsule Take 1 capsule (0.4 mg total) by mouth at bedtime. 30 capsule 11   No current facility-administered medications for this visit.    Allergies:   Statins    Social Lee:  The patient  reports that he quit smoking about 43 years ago. His smoking use included cigarettes. He has Lee 3.00 pack-year smoking Lee. He has never used smokeless tobacco. He reports current alcohol use. He reports that he does not use drugs.   Family Lee:  The patient's family Lee includes Cancer in his paternal uncle.    ROS:  Please see the Lee of present illness.     All other systems are reviewed and negative.    PHYSICAL EXAM: VS:  BP 118/68 (BP Location: Left Arm, Patient Position: Sitting, Cuff Size: Normal)   Pulse (!) 119   Ht '5\' 11"'$  (1.803 m)   Wt 221 lb 6.4 oz (100.4 kg)   BMI 30.88 kg/m  , BMI Body mass index is 30.88 kg/m. GENERAL:  Well appearing HEENT:  Pupils equal round and reactive, fundi not visualized, oral mucosa unremarkable NECK:  No jugular venous distention, waveform within normal limits, carotid upstroke brisk and symmetric, no bruits LUNGS:  Clear to auscultation bilaterally HEART:  Tachycardic.  Irregularly irregular.  PMI not displaced or sustained,S1 and S2 within normal limits, no S3, no S4, no clicks, no rubs, no murmurs ABD:  Flat, positive bowel sounds normal in frequency in pitch, no bruits, no rebound, no guarding, no midline pulsatile mass, no hepatomegaly, no splenomegaly EXT:  2 plus pulses throughout, no edema, no cyanosis no clubbing SKIN:  No rashes no nodules NEURO:  Cranial nerves II through XII grossly intact, motor grossly intact  throughout PSYCH:  Cognitively intact, oriented to person place and time  EKG:   05/20/22: Atrial fibrillation.  Rate 119 bpm.  05/16/2021: Atrial fibrillation.  Rate 96 bpm.   08/23/2019 atrial fibrillation.  Rate 170 bpm.  Following studies were reviewed: No previous cardiovascular studies  Recent Labs: 08/08/2021: ALT 36; BUN 32; Creatinine, Ser 1.61; Potassium 4.5; Sodium 133   05/31/2020: Sodium 138, potassium 4.5, BUN 25, creatinine 1.58 Total cholesterol 222, triglycerides  115, HDL 45, LDL 156 Hemoglobin A1c 7.3%   Lipid Panel    Component Value Date/Time   CHOL 173 08/08/2021 0820   TRIG 82 08/08/2021 0820   HDL 56 08/08/2021 0820   CHOLHDL 3.1 08/08/2021 0820   CHOLHDL 5 01/13/2013 0752   VLDL 52.2 (H) 01/13/2013 0752   LDLCALC 102 (H) 08/08/2021 0820   LDLDIRECT 114.8 01/13/2013 0752      Wt Readings from Last 3 Encounters:  05/20/22 221 lb 6.4 oz (100.4 kg)  10/08/21 223 lb 12.8 oz (101.5 kg)  05/16/21 221 lb 3.2 oz (100.3 kg)      ASSESSMENT AND PLAN: No problem-specific Assessment & Plan notes found for this encounter.   # Persistent atrial fibrillation:  Levi Lee remains in atrial fibrillation.  Heart rates are uncontrolled and higher today than usual.  He is completely asymptomatic and prefers not to try new medications.  We will get Lee new echocardiogram to make sure there is no evidence of heart failure.  For now continue Eliquis.  If his systolic function is starting to reduce we will need to lower his antihypertensives and add Lee beta-blocker.  # CAD, native:  # Hyperlipidemia: # Statin intolerance:  Calcium score was 30, which was 46th percentile.  Levi Lee has not tolerated several statins.  He is doing well on Repatha.  LDL is not quite at goal.  Keep working on diet and exercise.  # Essential hypertension:  Blood pressure is well-controlled.  Continue valsartan/HCTZ.  May need to add Lee beta-blocker as above.  # OSA:  Continue CPAP.  #  Diabetes:  # CKD III: Continue working with PCP.  Followed closely by Dr. Melford Aase.   Current medicines are reviewed at length with the patient today.  The patient does not have concerns regarding medicines.  The following changes have been made:  none  Labs/ tests ordered today include:   Orders Placed This Encounter  Procedures   EKG 12-Lead   ECHOCARDIOGRAM COMPLETE      Disposition:   FU with Levi Galindez C. Oval Linsey, MD, [facility] in 1 year      Signed, Levi Marcon C. Oval Linsey, MD, [facility]  05/20/2022 8:23 AM    [facility]

## 2022-06-03 ENCOUNTER — Ambulatory Visit (INDEPENDENT_AMBULATORY_CARE_PROVIDER_SITE_OTHER): Payer: Medicare Other

## 2022-06-03 DIAGNOSIS — I4819 Other persistent atrial fibrillation: Secondary | ICD-10-CM

## 2022-06-03 LAB — ECHOCARDIOGRAM COMPLETE
Area-P 1/2: 4.71 cm2
MV M vel: 4.56 m/s
MV Peak grad: 83.2 mmHg
S' Lateral: 2.77 cm

## 2022-06-12 ENCOUNTER — Telehealth (HOSPITAL_BASED_OUTPATIENT_CLINIC_OR_DEPARTMENT_OTHER): Payer: Self-pay | Admitting: *Deleted

## 2022-06-12 MED ORDER — METOPROLOL SUCCINATE ER 25 MG PO TB24: 25.0000 mg | ORAL_TABLET | Freq: Every day | ORAL | 3 refills | Status: DC

## 2022-06-12 NOTE — Telephone Encounter (Signed)
-----   Message from Skeet Latch, MD sent at 06/12/2022 11:52 AM EST ----- His heart pumping function is low normal.  In 2017 his heart was pumping at 64%.  Now it is 50 to 55%.  I think it is worth trying a low-dose of metoprolol succinate, such as 25 mg daily to see if that helps get his heart rate down.  If he does not want to try this then we can repeat his echo in 6 months to make sure that there is no progressive worsening.

## 2022-06-12 NOTE — Telephone Encounter (Signed)
Advised patient of results and sent Rx to [facility]

## 2022-07-23 ENCOUNTER — Telehealth: Payer: Self-pay | Admitting: Pharmacist

## 2022-07-23 NOTE — Telephone Encounter (Signed)
PA submitted for Repatha. Key: [id number]

## 2022-07-23 NOTE — Telephone Encounter (Signed)
Approved through 07/23/23

## 2022-08-01 ENCOUNTER — Telehealth (HOSPITAL_BASED_OUTPATIENT_CLINIC_OR_DEPARTMENT_OTHER): Payer: Self-pay | Admitting: Cardiovascular Disease

## 2022-08-01 MED ORDER — DAPAGLIFLOZIN PROPANEDIOL 10 MG PO TABS: 10.0000 mg | ORAL_TABLET | Freq: Every day | ORAL | 1 refills | Status: DC

## 2022-08-01 MED ORDER — REPATHA SURECLICK 140 MG/ML ~~LOC~~ SOAJ: 140.0000 mg | SUBCUTANEOUS | 3 refills | Status: DC

## 2022-08-01 NOTE — Telephone Encounter (Signed)
*  STAT* If patient is at the pharmacy, call can be transferred to refill team.   1. Which medications need to be refilled? (please list name of each medication and dose if known)   FARXIGA 10 MG TABS tablet  Evolocumab (REPATHA SURECLICK) 959 MG/ML SOAJ   2. Which pharmacy/location (including street and city if local pharmacy) is medication to be sent to?  [facility] # [place], [place]    3. Do they need a 30 day or 90 day supply? 90 day supply

## 2022-08-01 NOTE — Telephone Encounter (Signed)
Rx request sent to pharmacy.  

## 2022-10-07 ENCOUNTER — Ambulatory Visit (HOSPITAL_BASED_OUTPATIENT_CLINIC_OR_DEPARTMENT_OTHER): Payer: Medicare Other | Admitting: Pulmonary Disease

## 2022-10-11 ENCOUNTER — Ambulatory Visit (INDEPENDENT_AMBULATORY_CARE_PROVIDER_SITE_OTHER): Payer: Medicare Other | Admitting: Pulmonary Disease

## 2022-10-11 ENCOUNTER — Encounter (HOSPITAL_BASED_OUTPATIENT_CLINIC_OR_DEPARTMENT_OTHER): Payer: Self-pay | Admitting: Pulmonary Disease

## 2022-10-11 VITALS — BP 118/64 | HR 92 | Temp 98.3°F | Ht 71.0 in | Wt 219.2 lb

## 2022-10-11 DIAGNOSIS — G4733 Obstructive sleep apnea (adult) (pediatric): Secondary | ICD-10-CM | POA: Diagnosis not present

## 2022-10-11 NOTE — Patient Instructions (Signed)
X change to auto 8-12 cm

## 2022-10-11 NOTE — Assessment & Plan Note (Signed)
CPAP download was reviewed and shows better control of his events with residual AHI of 6.6/hour. However about 20% of the time events seem to be about 10/hours. He is very compliant 8 hours per night without a single missed night, average pressure is 11 cm with a maximum pressure of 12 cm on auto settings 5 to 12 cm. Overall CPAP is only helped improve his daytime somnolence and fatigue We will change to auto settings 8 to 12 cm and hopefully this will lower his AHI further.  Previously he had centrals on higher pressures and would limit maximum pressure of 12  Weight loss encouraged, compliance with goal of at least 4-6 hrs every night is the expectation. Advised against medications with sedative side effects Cautioned against driving when sleepy - understanding that sleepiness will vary on a day to day basis

## 2022-10-11 NOTE — Progress Notes (Signed)
   Subjective:    Patient ID: Levi Lee, male    DOB: February 12, 1951, 72 y.o.   MRN: [id number]  HPI  72 yo for FU of OSA   PM H-diabetes, hypertension, atrial fibrillation on Eliquis CKD 3   He could not tolerate high pressures on CPAP with more centrals on higher pressures.  Maintained on auto CPAP 5 to 12 cm   Chief Complaint  Patient presents with   Follow-up    Pt states he has been doing well since last visit and denies any complaints.   Annual follow-up visit. Weight is steady at 2 1 9  pounds. He has persistent atrial fibrillation with occasional RVR but remains asymptomatic. Cardiology consultation was reviewed. He reports nocturia and has been drinking a lot of cranberry juice for his CKD No problems with mask or pressure.  CPAP was changed his life and he reports good compliance  Significant tests/ events reviewed   NPSG 2013:  AHI 69/hr, optimal cpap 17cm (higher pressures caused increased centrals)  Review of Systems neg for any significant sore throat, dysphagia, itching, sneezing, nasal congestion or excess/ purulent secretions, fever, chills, sweats, unintended wt loss, pleuritic or exertional cp, hempoptysis, orthopnea pnd or change in chronic leg swelling. Also denies presyncope, palpitations, heartburn, abdominal pain, nausea, vomiting, diarrhea or change in bowel or urinary habits, dysuria,hematuria, rash, arthralgias, visual complaints, headache, numbness weakness or ataxia.     Objective:   Physical Exam  Gen. Pleasant, obese, in no distress ENT - no lesions, no post nasal drip Neck: No JVD, no thyromegaly, no carotid bruits Lungs: no use of accessory muscles, no dullness to percussion, decreased without rales or rhonchi  Cardiovascular: Rhythm regular, heart sounds  normal, no murmurs or gallops, no peripheral edema Musculoskeletal: No deformities, no cyanosis or clubbing , no tremors       Assessment & Plan:

## 2022-11-12 ENCOUNTER — Ambulatory Visit (INDEPENDENT_AMBULATORY_CARE_PROVIDER_SITE_OTHER): Payer: Medicare Other | Admitting: Dermatology

## 2022-11-12 ENCOUNTER — Encounter: Payer: Self-pay | Admitting: Dermatology

## 2022-11-12 ENCOUNTER — Telehealth (HOSPITAL_BASED_OUTPATIENT_CLINIC_OR_DEPARTMENT_OTHER): Payer: Self-pay | Admitting: Cardiovascular Disease

## 2022-11-12 VITALS — BP 138/87

## 2022-11-12 DIAGNOSIS — B360 Pityriasis versicolor: Secondary | ICD-10-CM | POA: Diagnosis not present

## 2022-11-12 DIAGNOSIS — L280 Lichen simplex chronicus: Secondary | ICD-10-CM

## 2022-11-12 DIAGNOSIS — L821 Other seborrheic keratosis: Secondary | ICD-10-CM | POA: Diagnosis not present

## 2022-11-12 MED ORDER — KETOCONAZOLE 2 % EX CREA: 1.0000 | TOPICAL_CREAM | Freq: Two times a day (BID) | CUTANEOUS | 0 refills | Status: AC

## 2022-11-12 NOTE — Telephone Encounter (Signed)
Returned call to patient's wife (ok per DPR), provided the following recommendations, they will talk tonight and let us know in the morning which option they would like to go with. Informed wife that I will not be here, but that Juliette Alcide could get them squared away or I would be back Thursday if they wanted to talk to me.     "Would he be willing to try again with just a 1/2 tablet (12.5 mg) nightly?  If not, then have him try carvedilol 3.125 mg bid and see if that will work."

## 2022-11-12 NOTE — Progress Notes (Unsigned)
   New Patient Visit   Subjective  Levi Lee is a 72 y.o. male who presents for the following:  Non itchy discolored round patches at the right chest and back x 1 month. He has not treated it with anything. He states he has a history of Eczema. A rough patch on the right 2nd finger x 10 years. He has not treated this. No personal or family history of skin cancer.   The following portions of the chart were reviewed this encounter and updated as appropriate: medications, allergies, medical history  Review of Systems:  No other skin or systemic complaints except as noted in HPI or Assessment and Plan.  Objective  Well appearing patient in no apparent distress; mood and affect are within normal limits.  A focused examination was performed of the following areas: Chest, back, and right hand  Round annular raised patches on chest  Relevant exam findings are noted in the Assessment and Plan.    Assessment & Plan   SEBORRHEIC KERATOSIS - Stuck-on, waxy, tan-brown papules and/or plaques  - Benign-appearing - Discussed benign etiology and prognosis. - Observe - Call for any changes      Tinea Versicolor Exam: tan thin plaque with fine scale   Current Status: flared   Treatment Plan: Apply Ketoconazole cream twice a day as needed until clear. Advised it can take many months for affected areas to repigment.  Pt Counseling: Tinea versicolor is a chronic recurrent skin rash causing discolored scaly spots most commonly seen on back, chest, and/or shoulders.  It is generally asymptomatic. The rash is due to overgrowth of a common type of yeast present on everyone's skin and it is not contagious.  It tends to flare more in the summer due to increased sweating on trunk.  After rash is treated, the scaliness will resolve, but the discoloration will take longer to return to normal pigmentation. The periodic use of an OTC medicated soap/shampoo with zinc or selenium sulfide can be helpful to  prevent yeast overgrowth and recurrence.     Lichenification / Lichen Simplex Chronicus Exam: rough dry thickened patch at left 2nd finger  Per pt, the areas is not chronically itchy however he is right hand dominant and the thickened skin is in an areas that does undergo chronic rubbing .  Treatment Plan: -Amlactin lotion daily  -Limit mechanical exfoliation with pumice stone to about once a week to avoid reflexive worsening.

## 2022-11-12 NOTE — Telephone Encounter (Signed)
Would he be willing to try again with just a 1/2 tablet (12.5 mg) nightly?  If not, then have him try carvedilol 3.125 mg bid and see if that will work.

## 2022-11-12 NOTE — Patient Instructions (Signed)
Due to recent changes in healthcare laws, you may see results of your pathology and/or laboratory studies on MyChart before the doctors have had a chance to review them. We understand that in some cases there may be results that are confusing or concerning to you. Please understand that not all results are received at the same time and often the doctors may need to interpret multiple results in order to provide you with the best plan of care or course of treatment. Therefore, we ask that you please give us 2 business days to thoroughly review all your results before contacting the office for clarification. Should we see a critical lab result, you will be contacted sooner.   If You Need Anything After Your Visit  If you have any questions or concerns for your doctor, please call our main line at [phone] If no one answers, please leave a voicemail as directed and we will return your call as soon as possible. Messages left after 4 pm will be answered the following business day.   You may also send us a message via MyChart. We typically respond to MyChart messages within 1-2 business days.  For prescription refills, please ask your pharmacy to contact our office. Our fax number is [phone].  If you have an urgent issue when the clinic is closed that cannot wait until the next business day, you can page your doctor at the number below.    Please note that while we do our best to be available for urgent issues outside of office hours, we are not available 24/7.   If you have an urgent issue and are unable to reach us, you may choose to seek medical care at your doctor's office, retail clinic, urgent care center, or emergency room.  If you have a medical emergency, please immediately call 911 or go to the emergency department. In the event of inclement weather, please call our main line at [phone] for an update on the status of any delays or closures.  Dermatology Medication Tips: Please  keep the boxes that topical medications come in in order to help keep track of the instructions about where and how to use these. Pharmacies typically print the medication instructions only on the boxes and not directly on the medication tubes.   If your medication is too expensive, please contact our office at [phone] or send us a message through MyChart.   We are unable to tell what your co-pay for medications will be in advance as this is different depending on your insurance coverage. However, we may be able to find a substitute medication at lower cost or fill out paperwork to get insurance to cover a needed medication.   If a prior authorization is required to get your medication covered by your insurance company, please allow us 1-2 business days to complete this process.  Drug prices often vary depending on where the prescription is filled and some pharmacies may offer cheaper prices.  The website www.goodrx.com contains coupons for medications through different pharmacies. The prices here do not account for what the cost may be with help from insurance (it may be cheaper with your insurance), but the website can give you the price if you did not use any insurance.  - You can print the associated coupon and take it with your prescription to the pharmacy.  - You may also stop by our office during regular business hours and pick up a GoodRx coupon card.  - If you need your   prescription sent electronically to a different pharmacy, notify our office through [facility] MyChart or by phone at [phone]     

## 2022-11-12 NOTE — Telephone Encounter (Signed)
Pt c/o medication issue:  1. Name of Medication:   metoprolol succinate (TOPROL XL) 25 MG 24 hr tablet   2. How are you currently taking this medication (dosage and times per day)?   3. Are you having a reaction (difficulty breathing--STAT)?   4. What is your medication issue?    Wife stated patient's hands have been going numb overnight.  Wife stated patient stopped taking the medication and the numbness went away and patient has now been taking the medication every other day.  Wife wants to know next steps.

## 2022-11-12 NOTE — Telephone Encounter (Signed)
Returned call to patient's wife (ok per dpr),   She states that when he takes the metoprolol his heart rate is too slow and at night when he is in the bed his hands and arms are going numb. He was trying to take it every other day and the problem got slightly better, then he stopped taking it all together and the problem completely went away.   Informed Ms. Kulpa I would have this reviewed and we could give her a call back!

## 2022-11-19 ENCOUNTER — Other Ambulatory Visit: Payer: Self-pay | Admitting: Nephrology

## 2022-11-19 DIAGNOSIS — I129 Hypertensive chronic kidney disease with stage 1 through stage 4 chronic kidney disease, or unspecified chronic kidney disease: Secondary | ICD-10-CM

## 2022-11-19 DIAGNOSIS — N400 Enlarged prostate without lower urinary tract symptoms: Secondary | ICD-10-CM

## 2022-11-19 DIAGNOSIS — I4891 Unspecified atrial fibrillation: Secondary | ICD-10-CM

## 2022-11-19 DIAGNOSIS — E1322 Other specified diabetes mellitus with diabetic chronic kidney disease: Secondary | ICD-10-CM

## 2022-11-19 DIAGNOSIS — M109 Gout, unspecified: Secondary | ICD-10-CM

## 2022-11-19 DIAGNOSIS — N1831 Chronic kidney disease, stage 3a: Secondary | ICD-10-CM

## 2022-11-19 MED ORDER — CARVEDILOL 3.125 MG PO TABS: 3.1250 mg | ORAL_TABLET | Freq: Two times a day (BID) | ORAL | 3 refills | Status: DC

## 2022-11-19 NOTE — Telephone Encounter (Addendum)
Spoke with wife and patient willing to try Carvedilol

## 2022-11-19 NOTE — Telephone Encounter (Signed)
Patient's wife is following up. She states she forgot to call back last week. She states patient started taking Metoprolol every other day. She states she is unable to split the tablets  in half.

## 2022-11-19 NOTE — Addendum Note (Signed)
Addended by: Regis Bill B on: 11/19/2022 04:44 PM   Modules accepted: Orders

## 2022-12-30 ENCOUNTER — Ambulatory Visit
Admission: RE | Admit: 2022-12-30 | Discharge: 2022-12-30 | Disposition: A | Discharge status: Home / Self Care | Payer: Medicare Other | Source: Ambulatory Visit | Attending: Nephrology | Admitting: Nephrology

## 2022-12-30 DIAGNOSIS — I4891 Unspecified atrial fibrillation: Secondary | ICD-10-CM

## 2022-12-30 DIAGNOSIS — I129 Hypertensive chronic kidney disease with stage 1 through stage 4 chronic kidney disease, or unspecified chronic kidney disease: Secondary | ICD-10-CM

## 2022-12-30 DIAGNOSIS — N1831 Chronic kidney disease, stage 3a: Secondary | ICD-10-CM

## 2022-12-30 DIAGNOSIS — M109 Gout, unspecified: Secondary | ICD-10-CM

## 2022-12-30 DIAGNOSIS — N400 Enlarged prostate without lower urinary tract symptoms: Secondary | ICD-10-CM

## 2023-01-28 ENCOUNTER — Other Ambulatory Visit (HOSPITAL_BASED_OUTPATIENT_CLINIC_OR_DEPARTMENT_OTHER): Payer: Self-pay | Admitting: Cardiovascular Disease

## 2023-01-28 NOTE — Telephone Encounter (Signed)
Rx request sent to pharmacy.  

## 2023-02-11 ENCOUNTER — Telehealth: Payer: Self-pay | Admitting: Gastroenterology

## 2023-02-11 NOTE — Telephone Encounter (Signed)
Inbound call from patient's wife regarding recall colonoscopy. Advised patient is not due until 02/2025. States she is unsure why patient is due every 10 years since patient has a history of polyps. Requesting a call back to discuss further. Please advise, thank you.

## 2023-02-11 NOTE — Telephone Encounter (Signed)
The pt wife has been advised that he is due 02/2025 due to the last procedure states Benign not precancerous.  She agrees and will call if he has any issues prior to that time.

## 2023-04-23 ENCOUNTER — Telehealth (HOSPITAL_BASED_OUTPATIENT_CLINIC_OR_DEPARTMENT_OTHER): Payer: Self-pay | Admitting: Pharmacy Technician

## 2023-04-23 ENCOUNTER — Other Ambulatory Visit (HOSPITAL_COMMUNITY): Payer: Self-pay

## 2023-04-23 NOTE — Telephone Encounter (Signed)
Pharmacy Patient Advocate Encounter  Received notification from [facility] that Prior Authorization for Marcelline Deist has been APPROVED from 04/23/23 to 04/22/24   PA #/Case ID/Reference #: [id number]

## 2023-04-23 NOTE — Telephone Encounter (Signed)
Patient notified

## 2023-05-30 ENCOUNTER — Ambulatory Visit (INDEPENDENT_AMBULATORY_CARE_PROVIDER_SITE_OTHER): Payer: Medicare Other | Admitting: Cardiovascular Disease

## 2023-05-30 ENCOUNTER — Encounter (HOSPITAL_BASED_OUTPATIENT_CLINIC_OR_DEPARTMENT_OTHER): Payer: Self-pay | Admitting: Cardiovascular Disease

## 2023-05-30 VITALS — BP 114/76 | HR 96 | Ht 71.0 in | Wt 226.5 lb

## 2023-05-30 DIAGNOSIS — G4733 Obstructive sleep apnea (adult) (pediatric): Secondary | ICD-10-CM | POA: Diagnosis not present

## 2023-05-30 DIAGNOSIS — I2510 Atherosclerotic heart disease of native coronary artery without angina pectoris: Secondary | ICD-10-CM

## 2023-05-30 DIAGNOSIS — I4819 Other persistent atrial fibrillation: Secondary | ICD-10-CM | POA: Diagnosis not present

## 2023-05-30 DIAGNOSIS — I10 Essential (primary) hypertension: Secondary | ICD-10-CM | POA: Diagnosis not present

## 2023-05-30 DIAGNOSIS — E7800 Pure hypercholesterolemia, unspecified: Secondary | ICD-10-CM

## 2023-05-30 NOTE — Patient Instructions (Signed)
Medication Instructions:  Your physician recommends that you continue on your current medications as directed. Please refer to the Current Medication list given to you today.   *If you need a refill on your cardiac medications before your next appointment, please call your pharmacy*  Lab Work: HAVE YOUR PRIMARY SEND COPY OF YOUR LABS WHEN YOU HAVE THEM DONE  If you have labs (blood work) drawn today and your tests are completely normal, you will receive your results only by: MyChart Message (if you have MyChart) OR A paper copy in the mail If you have any lab test that is abnormal or we need to change your treatment, we will call you to review the results.  Testing/Procedures: NONE  Follow-Up: At [facility], you and your health needs are our priority.  As part of our continuing mission to provide you with exceptional heart care, we have created designated Provider Care Teams.  These Care Teams include your primary Cardiologist (physician) and Advanced Practice Providers (APPs -  Physician Assistants and Nurse Practitioners) who all work together to provide you with the care you need, when you need it.  We recommend signing up for the patient portal called "MyChart".  Sign up information is provided on this After Visit Summary.  MyChart is used to connect with patients for Virtual Visits (Telemedicine).  Patients are able to view lab/test results, encounter notes, upcoming appointments, etc.  Non-urgent messages can be sent to your provider as well.   To learn more about what you can do with MyChart, go to [email or web address].    Your next appointment:   12 month(s)  Provider:   Chilton Si, MD or Gillian Shields, NP     Exercise recommendations: The American Heart Association recommends 150 minutes of moderate intensity exercise weekly. Try 30 minutes of moderate intensity exercise 4-5 times per week. This could include walking, jogging, or swimming.

## 2023-05-30 NOTE — Progress Notes (Signed)
Cardiology Office Note:  .   Date:  05/30/2023  ID:  Levi Lee, DOB [birth year], MRN [id number] PCP: Eartha Inch, MD  [facility] Providers Cardiologist:  None    History of Present Illness: .   Levi Lee is a 72 y.o. male  with persistent atrial fibrillation, diabetes, hypertension, CKD 3, non-obstructive CAD, hyperlipidemia, and OSA on CPAP here for follow-up.  Levi Lee was seen in the ER 08/22/20 with urinary retention.  He was found to have prostatitis.  He notes that he started taking a new OTC cholesterol medication a couple days prior.  EKG was consistent with atrial fibrillation at a rate of 117 bpm.  Metoprolol was added to his regimen.  He was asymptomatic and had no awareness of his atrial fibrillation.   Levi Lee has a history of atrial fibrillation previously treated by cardiologist at [facility] in 2017.  He has been on Eliquis since that time.  He did not previously require rate control.  He had a stress test at [facility] in 2017 which revealed LVEF 64% with no ischemia.  Levi Lee has a history of hyperlipidemia but has not tolerated statins. He recalls trying atorvastatin, rosuvastatin, and pravastatin.  He was refferred for a calcium score 07/2020 which was 46th percentile for age and gender. He was started on Repatha which was tolerated well.  At his appointment 04/2022 he remained in atrial fibrillation and was doing well.  He had no symptoms and was working on diet and exercise.  Echo revealed LVEF 50-55% with indeterminate diastolic function and mild LVH.  Levi Lee reports feeling well overall. He has been maintaining an active lifestyle, playing golf twice a week, during which he estimates he walks approximately 10,000 steps. He reports no chest pain, pressure, or significant dyspnea, except when going up steep grades, which he considers normal. He has been compliant with his medications, including Repatha injections for cholesterol management.  The patient also mentions  some ankle swelling, which he notes tends to reduce after a day of golf. He has not reported any symptoms of lightheadedness or dizziness.  He has no orthopnea or PND.  He has been consuming caffeine, which he suspects may have contributed to a slightly elevated heart rate during the visit.  The patient's spouse is scheduled for knee surgery, which has led to some changes in his usual activities. He also mentions a grandson who is currently studying medicine. The patient is due for a cholesterol check with his primary care provider in the coming weeks.       ROS:  As per HPI  Studies Reviewed: [name]   EKG Interpretation Date/Time:  Friday May 30 2023 08:00:40 EDT Ventricular Rate:  107 PR Interval:    QRS Duration:  72 QT Interval:  336 QTC Calculation: 448 R Axis:   -16  Text Interpretation: Atrial fibrillation with rapid ventricular response When compared with ECG of 22-Aug-2020 14:50, No significant change was found Confirmed by Chilton Si ([id number]) on 05/30/2023 8:06:41 AM   Echo 05/2022  Risk Assessment/Calculations:    CHA2DS2-VASc Score = 3   This indicates a 3.2% annual risk of stroke. The patient's score is based upon: CHF History: 0 HTN History: 1 Diabetes History: 1 Stroke History: 0 Vascular Disease History: 0 Age Score: 1 Gender Score: 0            Physical Exam:   VS:  BP 114/76   Pulse 96   Ht 5\' 11"  (1.803 m)  Wt 226 lb 8 oz (102.7 kg)   SpO2 96%   BMI 31.59 kg/m  , BMI Body mass index is 31.59 kg/m. GENERAL:  Well appearing HEENT: Pupils equal round and reactive, fundi not visualized, oral mucosa unremarkable NECK:  No jugular venous distention, waveform within normal limits, carotid upstroke brisk and symmetric, no bruits, no thyromegaly LUNGS:  Clear to auscultation bilaterally HEART:  Irregularly irregular.  PMI not displaced or sustained,S1 and S2 within normal limits, no S3, no S4, no clicks, no rubs, no murmurs ABD:  Flat, positive  bowel sounds normal in frequency in pitch, no bruits, no rebound, no guarding, no midline pulsatile mass, no hepatomegaly, no splenomegaly EXT:  2 plus pulses throughout, no edema, no cyanosis no clubbing SKIN:  No rashes no nodules NEURO:  Cranial nerves II through XII grossly intact, motor grossly intact throughout PSYCH:  Cognitively intact, oriented to person place and time   ASSESSMENT AND PLAN: .    # Persistent Atrial Fibrillation Heart rate slightly elevated at the beginning of the visit, but decreased upon recheck. Patient is asymptomatic. Currently on Carvedilol and Eliquis.  Considered switching to metoprolol but it made him fatigued in the past.  -Continue current medications. -Check blood pressure and heart rate at home for the next week and report findings.  # Hypertension Well controlled on carvedilol,  Losartan and Hydrochlorothiazide. -Continue current medications.  # Hyperlipidemia On Repatha, LDL goal is under 70. -Request cholesterol labs from primary care provider.  This will be checked next month.  He hasn't tolerated statins.  -Continue Repatha.  # Peripheral Edema Noted mild ankle swelling, improves with activity.  LVEF 50% on echo.  -Increase physical activity to at least 150 minutes per week. -Consider adding an additional day of golf or walking.  Follow-up in 1 year, or sooner if needed.            Dispo:  F/u 1 year  Signed, Chilton Si, MD

## 2023-07-07 ENCOUNTER — Other Ambulatory Visit (HOSPITAL_BASED_OUTPATIENT_CLINIC_OR_DEPARTMENT_OTHER): Payer: Self-pay | Admitting: Cardiovascular Disease

## 2023-07-09 ENCOUNTER — Other Ambulatory Visit (HOSPITAL_COMMUNITY): Payer: Self-pay

## 2023-07-09 ENCOUNTER — Telehealth: Payer: Self-pay | Admitting: Pharmacy Technician

## 2023-07-09 NOTE — Telephone Encounter (Signed)
Pharmacy Patient Advocate Encounter   Received notification from Fax that prior authorization for repatha is required/requested.   Insurance verification completed.   The patient is insured through [facility] .   Per test claim: PA required; PA submitted to above mentioned insurance via CoverMyMeds Key/confirmation #/EOC [id number] Status is pending

## 2023-07-14 ENCOUNTER — Other Ambulatory Visit (HOSPITAL_COMMUNITY): Payer: Self-pay

## 2023-07-14 NOTE — Telephone Encounter (Signed)
Mychart message sent to patient advising approved

## 2023-07-14 NOTE — Telephone Encounter (Signed)
Pharmacy Patient Advocate Encounter  Received notification from [facility] that Prior Authorization for repatha has been APPROVED from 07/12/23 to 07/09/24   PA #/Case ID/Reference #: [id number]

## 2023-07-25 ENCOUNTER — Other Ambulatory Visit (HOSPITAL_BASED_OUTPATIENT_CLINIC_OR_DEPARTMENT_OTHER): Payer: Self-pay | Admitting: Cardiovascular Disease

## 2023-10-16 ENCOUNTER — Other Ambulatory Visit (HOSPITAL_COMMUNITY): Payer: Self-pay

## 2023-10-16 ENCOUNTER — Telehealth (HOSPITAL_BASED_OUTPATIENT_CLINIC_OR_DEPARTMENT_OTHER): Payer: Self-pay | Admitting: *Deleted

## 2023-10-16 ENCOUNTER — Telehealth: Payer: Self-pay | Admitting: Pharmacy Technician

## 2023-10-16 NOTE — Telephone Encounter (Signed)
 Pharmacy Patient Advocate Encounter   Received notification from Pt Calls Messages that prior authorization for repatha is required/requested.   Insurance verification completed.   The patient is insured through [employer] .   Per test claim: PA required; PA submitted to above mentioned insurance via CoverMyMeds Key/confirmation #/EOC [id number] Status is pending

## 2023-10-16 NOTE — Telephone Encounter (Signed)
 Received notification from patients insurance regarding Repatha Will forward to RX PA team to review, according to records patient has PA through 06/2024

## 2023-10-16 NOTE — Telephone Encounter (Signed)
 Pharmacy Patient Advocate Encounter  Received notification from [employer] that Prior Authorization for repatha has been APPROVED from 07/30/23 to 10/15/24. Unable to obtain price due to refill too soon rejection, last fill date 10/15/23 next available fill date4/9/25   PA #/Case ID/Reference #: [id number]

## 2023-10-16 NOTE — Telephone Encounter (Signed)
 Hi, he has new insurance now so I just submitted the prior auth on his new insurance. Thank you!

## 2023-10-27 ENCOUNTER — Encounter (HOSPITAL_BASED_OUTPATIENT_CLINIC_OR_DEPARTMENT_OTHER): Payer: Self-pay | Admitting: Pulmonary Disease

## 2023-10-27 ENCOUNTER — Ambulatory Visit (HOSPITAL_BASED_OUTPATIENT_CLINIC_OR_DEPARTMENT_OTHER): Payer: Medicare Other | Admitting: Pulmonary Disease

## 2023-10-27 VITALS — BP 122/80 | HR 86 | Ht 71.0 in | Wt 225.3 lb

## 2023-10-27 DIAGNOSIS — G4733 Obstructive sleep apnea (adult) (pediatric): Secondary | ICD-10-CM

## 2023-10-27 NOTE — Patient Instructions (Signed)
 CPAP is working well under current settings. CPAP supplies will be renewed for a year

## 2023-10-27 NOTE — Progress Notes (Signed)
   Subjective:    Patient ID: Levi Lee, male    DOB: 1950/10/14, 73 y.o.   MRN: [id number]  HPI  73 yo for FU of OSA   PM H-diabetes, hypertension, atrial fibrillation on Eliquis CKD 3   He could not tolerate high pressures on CPAP with more centrals on higher pressures.  Maintained on auto CPAP 5 to 12 cm   The patient, with a history of sleep apnea, presents for a routine yearly follow up. He reports dissatisfaction with his new CPAP machine, stating it does not turn on immediately upon mask placement and requires more frequent water refills compared to his previous model. Despite these issues, he continues to use the machine every night, noting improved rest and fewer awakenings compared to nights without the machine. He also travels with his older model. He has no other complaints related to his sleep apnea.   CPAP download was reviewed which shows control of events on auto settings 8 to recent lab events that has been AHI of 8/hour, certain nights going up to 10/hour.  Leak. He is very compliant CPAP certainly helped his daytime somnolence and fatigue   Significant tests/ events reviewed   NPSG 2013:  AHI 69/hr, optimal cpap 17cm (higher pressures caused increased centrals)   Review of Systems neg for any significant sore throat, dysphagia, itching, sneezing, nasal congestion or excess/ purulent secretions, fever, chills, sweats, unintended wt loss, pleuritic or exertional cp, hempoptysis, orthopnea pnd or change in chronic leg swelling. Also denies presyncope, palpitations, heartburn, abdominal pain, nausea, vomiting, diarrhea or change in bowel or urinary habits, dysuria,hematuria, rash, arthralgias, visual complaints, headache, numbness weakness or ataxia.     Objective:   Physical Exam  Gen. Pleasant, obese, in no distress ENT - no lesions, no post nasal drip Neck: No JVD, no thyromegaly, no carotid bruits Lungs: no use of accessory muscles, no dullness to percussion,  decreased without rales or rhonchi  Cardiovascular: Rhythm regular, heart sounds  normal, no murmurs or gallops, no peripheral edema Musculoskeletal: No deformities, no cyanosis or clubbing , no tremors       Assessment & Plan:   Obstructive Sleep Apnea He adheres to CPAP therapy nightly, improving daytime alertness and reducing apnea events to 7-8 per hour. He reports dissatisfaction with the newer CPAP model due to manual activation and increased water usage but maintains a good mask seal with minimal leaks and no significant dryness of the mouth. CPAP settings are effective at 8-12 cm H2O. - Renew CPAP prescription and supplies. - Maintain current CPAP settings. -Weight loss encouraged, compliance with goal of at least 4-6 hrs every night is the expectation. Advised against medications with sedative side effects Cautioned against driving when sleepy - understanding that sleepiness will vary on a day to day basis   Atrial Fibrillation He is on anticoagulation therapy and Coreg. Heart rate and blood pressure are well-managed.

## 2023-11-05 ENCOUNTER — Other Ambulatory Visit (HOSPITAL_BASED_OUTPATIENT_CLINIC_OR_DEPARTMENT_OTHER): Payer: Self-pay | Admitting: Family

## 2024-02-14 ENCOUNTER — Other Ambulatory Visit (HOSPITAL_BASED_OUTPATIENT_CLINIC_OR_DEPARTMENT_OTHER): Payer: Self-pay | Admitting: Cardiovascular Disease

## 2024-04-23 ENCOUNTER — Other Ambulatory Visit (HOSPITAL_BASED_OUTPATIENT_CLINIC_OR_DEPARTMENT_OTHER): Payer: Self-pay | Admitting: Family

## 2024-06-02 ENCOUNTER — Ambulatory Visit (INDEPENDENT_AMBULATORY_CARE_PROVIDER_SITE_OTHER): Admitting: Cardiovascular Disease

## 2024-06-02 ENCOUNTER — Encounter (HOSPITAL_BASED_OUTPATIENT_CLINIC_OR_DEPARTMENT_OTHER): Payer: Self-pay | Admitting: Cardiovascular Disease

## 2024-06-02 ENCOUNTER — Telehealth (HOSPITAL_BASED_OUTPATIENT_CLINIC_OR_DEPARTMENT_OTHER): Payer: Self-pay | Admitting: *Deleted

## 2024-06-02 VITALS — BP 122/68 | HR 86 | Ht 71.0 in | Wt 215.2 lb

## 2024-06-02 DIAGNOSIS — I10 Essential (primary) hypertension: Secondary | ICD-10-CM | POA: Diagnosis not present

## 2024-06-02 DIAGNOSIS — I4819 Other persistent atrial fibrillation: Secondary | ICD-10-CM | POA: Diagnosis not present

## 2024-06-02 DIAGNOSIS — I2510 Atherosclerotic heart disease of native coronary artery without angina pectoris: Secondary | ICD-10-CM

## 2024-06-02 DIAGNOSIS — G4733 Obstructive sleep apnea (adult) (pediatric): Secondary | ICD-10-CM

## 2024-06-02 DIAGNOSIS — E7800 Pure hypercholesterolemia, unspecified: Secondary | ICD-10-CM

## 2024-06-02 MED ORDER — LOSARTAN POTASSIUM-HCTZ 50-12.5 MG PO TABS: 1.0000 | ORAL_TABLET | Freq: Every day | ORAL | 3 refills | Status: AC

## 2024-06-02 MED ORDER — METOPROLOL SUCCINATE ER 25 MG PO TB24: 25.0000 mg | ORAL_TABLET | Freq: Every day | ORAL | 3 refills | Status: AC

## 2024-06-02 NOTE — Progress Notes (Addendum)
 Cardiology Office Note:  .   Date:  06/02/2024  ID:  Levi Lee, DOB [birth year], MRN [id number] PCP: Beam, Lamar POUR, MD  [facility] Providers Cardiologist:  None    History of Present Illness: .    Levi Lee is a 73 y.o. male with persistent atrial fibrillation, diabetes, hypertension, CKD 3, non-obstructive CAD, hyperlipidemia, and OSA on CPAP here for follow-up.  Levi Lee was seen in the ER 08/22/20 with urinary retention.  He was found to have prostatitis.  He notes that he started taking a new OTC cholesterol medication a couple days prior.  EKG was consistent with atrial fibrillation at a rate of 117 bpm.  Metoprolol  was added to his regimen.  He was asymptomatic and had no awareness of his atrial fibrillation.   Levi Lee has a history of atrial fibrillation previously treated by cardiologist at [facility] in 2017.  He has been on Eliquis since that time.  He did not require rate control and was asymptomatic.  He had a stress test at [facility] in 2017 which revealed LVEF 64% with no ischemia.  Levi Lee has a history of hyperlipidemia but has not tolerated statins. He recalls trying atorvastatin, rosuvastatin , and pravastatin.  He was refferred for a calcium  score 07/2020 which was 46th percentile for age and gender. He was started on Repatha  which was tolerated well.  At his appointment 04/2022 he remained in atrial fibrillation and was doing well.  He had no symptoms and was working on diet and exercise.  Echo revealed LVEF 50-55% with indeterminate diastolic function and mild LVH.  At his visit 05/2023 he was feeling well and playing golf regularly.   Discussed the use of AI scribe software for clinical note transcription with the patient, who gave verbal consent to proceed.  History of Present Illness Levi Lee has been experiencing fluctuating blood pressure readings at home, with recent measurements as low as 99/66 mmHg and occasionally as low as 91/52 mmHg. When his blood pressure is  low, he skips his losartan hydrochlorothiazide  dose, taking it only three to four times a week. He uses an arm cuff provided by [employer] and [facility] to monitor his blood pressure daily before taking his medication.  He doesn't experience lightheadedness or dizziness when this occurs.   He continues to take carvedilol  twice daily and apixaban. He mentions a recent weight loss of about ten pounds, which he attributes to increased physical activity and dietary changes.  He is doing well on Ozempic.  He engages in physical activities such as playing golf twice a week and walking, reporting a recent walk of four and a half miles. He feels fine during these activities, although he experiences increased breathing effort on inclines. He has reduced his intake of fatty foods and reports eating more fruits and vegetables, often bringing lunch from home.  Recent laboratory results showed a total cholesterol of 113 mg/dL, triglycerides at 99 mg/dL, HDL at 53 mg/dL, and LDL at 40 mg/dL. His A1c has decreased from 7.8% to 7.4%, although his fasting blood sugar remains elevated at 165 mg/dL. Liver enzymes are elevated.  He has a history of slightly low platelet counts, with the most recent count at 130 x10^9/L. He mentions a previous consultation with a GI doctor regarding his liver function. No lightheadedness reported when transitioning from sitting to standing. Breathing is good during physical activities.   ROS:  As per HPI  Studies Reviewed: SABRA   EKG Interpretation Date/Time:  Wednesday  June 02 2024 08:10:58 EST Ventricular Rate:  111 PR Interval:    QRS Duration:  76 QT Interval:  298 QTC Calculation: 405 R Axis:   -26  Text Interpretation: Atrial fibrillation with rapid ventricular response When compared with ECG of 30-May-2023 08:00, No significant change was found Confirmed by Raford Riggs ([id number]) on 06/02/2024 8:16:28 AM   Echo 05/2022:  1. Left ventricular ejection fraction, by  estimation, is 50 to 55%. The  left ventricle has low normal function. There is mild concentric left  ventricular hypertrophy. Indeterminate diastolic filling due to E-A  fusion.   2. Right ventricular systolic function is low normal. The right  ventricular size is mildly enlarged.   3. Left atrial size was mildly dilated.   4. Right atrial size was mildly dilated.   5. The mitral valve is normal in structure. Trivial mitral valve  regurgitation.   6. Difficult to see, but AV is probably trileaflet.. The aortic valve has  an indeterminant number of cusps. Aortic valve regurgitation is not  visualized. Aortic valve sclerosis is present, with no evidence of aortic  valve stenosis.   7. The inferior vena cava is normal in size with greater than 50%  respiratory variability, suggesting right atrial pressure of 3 mmHg.   Risk Assessment/Calculations:    CHA2DS2-VASc Score = 4   This indicates a 4.8% annual risk of stroke. The patient's score is based upon: CHF History: 0 HTN History: 1 Diabetes History: 1 Stroke History: 0 Vascular Disease History: 0 Age Score: 2 Gender Score: 0            Physical Exam:   VS:  BP 122/68   Pulse 86   Ht 5' 11 (1.803 m)   Wt 215 lb 3.2 oz (97.6 kg)   SpO2 97%   BMI 30.01 kg/m  , BMI Body mass index is 30.01 kg/m. GENERAL:  Well appearing HEENT: Pupils equal round and reactive, fundi not visualized, oral mucosa unremarkable NECK:  No jugular venous distention, waveform within normal limits, carotid upstroke brisk and symmetric, no bruits, no thyromegaly LUNGS:  Clear to auscultation bilaterally HEART:  Irregularly irregular.  PMI not displaced or sustained,S1 and S2 within normal limits, no S3, no S4, no clicks, no rubs, no murmurs ABD:  Flat, positive bowel sounds normal in frequency in pitch, no bruits, no rebound, no guarding, no midline pulsatile mass, no hepatomegaly, no splenomegaly EXT:  2 plus pulses throughout, no edema, no cyanosis  no clubbing SKIN:  No rashes no nodules NEURO:  Cranial nerves II through XII grossly intact, motor grossly intact throughout PSYCH:  Cognitively intact, oriented to person place and time   ASSESSMENT AND PLAN: .    Assessment & Plan # Primary hypertension Blood pressure readings at home are low, with a recent reading of 99/66 mmHg. He skips losartan hydrochlorothiazide  on days with low readings. Current office reading is 122/68 mmHg.   He did not take any medication this am other than carvedilol .  Weight loss of 10 pounds may contribute to improved blood pressure control. Accuracy of home blood pressure monitor is uncertain. - Bring home blood pressure monitor to office for comparison with office readings. - Reassess blood pressure management plan after comparing home and office readings. - Consider switching carvedilol  to metoprolol  if blood pressure remains well-controlled and heart rate is elevated.  # Permanent atrial fibrillation Heart rate is elevated, and carvedilol  is used for rate control. Current heart rate management may need adjustment if  blood pressure is well-controlled. - Consider switching carvedilol  to metoprolol  for better heart rate control if blood pressure remains well-controlled.  # Atherosclerotic heart disease of native coronary artery, non-obstructive.   Cholesterol levels are well-controlled with Repatha . Total cholesterol is 113 mg/dL, triglycerides are 99 mg/dL, HDL is 53 mg/dL. He is compliant with Repatha  and reports no issues. - Continue Repatha  as prescribed. - Continue to encourage regular exercise.   # DM: Working on control.  A1c decreased from 7.8% to 7.4%.  # Elevated LFTs:  He reports being seen by GI.  Limiting EtOH use.   # OSA: Continue to use CPAP.  Working on weight loss as above.    Dispo: f/u 1 year  Signed, Annabella Scarce, MD   Addendum:  Levi Lee brought his machine, which is accurate.  BP has been consistently low.  We will  reduce losartan/hydrochlorothiazide  to 50/12.5mg . Stop carvedilol  and switch to metoprolol  succinate 25mg  daily for improved heart rate control.  Continue to track BP at home.  Will Schier C. Scarce, MD, [facility] 06/02/2024 6:43 PM

## 2024-06-02 NOTE — Telephone Encounter (Signed)
 Patient brought blood pressure monitor in after visit this morning to check his machine with ours Home machine 114/80, our 110/72  Blood pressure at home this am 96/68, no Losartan-HCT today  Only takes about 3 to 4 times a week  Discussed with Dr Raford and will have patient decrease Losartan-HCT to 50-12.5 mg daily and stop Carvedilol  and start Toprol  XL 25 mg daily   Advised patient, verbalized understanding

## 2024-06-02 NOTE — Addendum Note (Signed)
 Addended by: FREDIRICK BEAU B on: 06/02/2024 03:19 PM   Modules accepted: Orders

## 2024-06-02 NOTE — Patient Instructions (Addendum)
 Medication Instructions:  STOP CARVEDILOL    START METOPROLOL  SUCC 25 MG DAILY   DECREASE YOUR LOSARTAN-HCT TO 1/2 TABLET DAILY (50-12.5 MG)    *If you need a refill on your cardiac medications before your next appointment, please call your pharmacy*  Lab Work: NONE  Testing/Procedures: NONE  Follow-Up: At [facility], you and your health needs are our priority.  As part of our continuing mission to provide you with exceptional heart care, our providers are all part of one team.  This team includes your primary Cardiologist (physician) and Advanced Practice Providers or APPs (Physician Assistants and Nurse Practitioners) who all work together to provide you with the care you need, when you need it.  Your next appointment:   12 month(s)  Provider:   Annabella Scarce, MD, Rosaline Bane, NP, or Reche Finder, NP     We recommend signing up for the patient portal called MyChart.  Sign up information is provided on this After Visit Summary.  MyChart is used to connect with patients for Virtual Visits (Telemedicine).  Patients are able to view lab/test results, encounter notes, upcoming appointments, etc.  Non-urgent messages can be sent to your provider as well.   To learn more about what you can do with MyChart, go to [email or web address].

## 2024-06-07 ENCOUNTER — Other Ambulatory Visit (HOSPITAL_BASED_OUTPATIENT_CLINIC_OR_DEPARTMENT_OTHER): Payer: Self-pay | Admitting: Cardiovascular Disease

## 2024-06-07 DIAGNOSIS — E7800 Pure hypercholesterolemia, unspecified: Secondary | ICD-10-CM

## 2024-06-07 DIAGNOSIS — I2510 Atherosclerotic heart disease of native coronary artery without angina pectoris: Secondary | ICD-10-CM

## 2024-07-21 ENCOUNTER — Other Ambulatory Visit (HOSPITAL_BASED_OUTPATIENT_CLINIC_OR_DEPARTMENT_OTHER): Payer: Self-pay | Admitting: Cardiovascular Disease

## 2024-08-03 ENCOUNTER — Other Ambulatory Visit (HOSPITAL_BASED_OUTPATIENT_CLINIC_OR_DEPARTMENT_OTHER): Payer: Self-pay | Admitting: Cardiovascular Disease

## 2024-08-17 ENCOUNTER — Other Ambulatory Visit (HOSPITAL_BASED_OUTPATIENT_CLINIC_OR_DEPARTMENT_OTHER): Payer: Self-pay | Admitting: Cardiovascular Disease

## 2024-08-17 DIAGNOSIS — I2510 Atherosclerotic heart disease of native coronary artery without angina pectoris: Secondary | ICD-10-CM

## 2024-08-17 DIAGNOSIS — E7800 Pure hypercholesterolemia, unspecified: Secondary | ICD-10-CM
